# Patient Record
Sex: Female | Born: 1935 | Hispanic: No | Smoking: Never smoker
Health system: Southern US, Community
[De-identification: ages and names within clinical notes are randomized; demographics above are authoritative.]

## PROBLEM LIST (undated history)

## (undated) DIAGNOSIS — E785 Hyperlipidemia, unspecified: Secondary | ICD-10-CM

## (undated) DIAGNOSIS — F0390 Unspecified dementia without behavioral disturbance: Secondary | ICD-10-CM

## (undated) DIAGNOSIS — I10 Essential (primary) hypertension: Secondary | ICD-10-CM

## (undated) DIAGNOSIS — M1990 Unspecified osteoarthritis, unspecified site: Secondary | ICD-10-CM

## (undated) DIAGNOSIS — D329 Benign neoplasm of meninges, unspecified: Secondary | ICD-10-CM

## (undated) DIAGNOSIS — H9190 Unspecified hearing loss, unspecified ear: Secondary | ICD-10-CM

## (undated) HISTORY — PX: ABDOMINAL HYSTERECTOMY: SHX81

## (undated) HISTORY — PX: CHOLECYSTECTOMY: SHX55

---

## 2015-09-20 ENCOUNTER — Other Ambulatory Visit (HOSPITAL_COMMUNITY)
Admission: RE | Admit: 2015-09-20 | Discharge: 2015-09-20 | Disposition: A | Discharge status: Home / Self Care | Payer: Medicare Other | Source: Ambulatory Visit | Attending: Internal Medicine | Admitting: Internal Medicine

## 2015-09-20 DIAGNOSIS — N390 Urinary tract infection, site not specified: Secondary | ICD-10-CM | POA: Insufficient documentation

## 2015-09-20 LAB — URINALYSIS, ROUTINE W REFLEX MICROSCOPIC
Bilirubin Urine: NEGATIVE
GLUCOSE, UA: NEGATIVE mg/dL
HGB URINE DIPSTICK: NEGATIVE
Ketones, ur: NEGATIVE mg/dL
Nitrite: NEGATIVE
Protein, ur: 100 mg/dL — AB
SPECIFIC GRAVITY, URINE: 1.01 (ref 1.005–1.030)
pH: >9 — ABNORMAL HIGH (ref 5.0–8.0)

## 2015-09-20 LAB — URINE MICROSCOPIC-ADD ON
RBC / HPF: NONE SEEN RBC/hpf (ref 0–5)
SQUAMOUS EPITHELIAL / LPF: NONE SEEN

## 2015-09-22 LAB — URINE CULTURE

## 2015-09-26 ENCOUNTER — Inpatient Hospital Stay (HOSPITAL_COMMUNITY)
Admission: EM | Admit: 2015-09-26 | Discharge: 2015-10-03 | DRG: 579 | Disposition: A | Discharge status: Skilled Nursing Facility | Payer: Medicare Other | Attending: Internal Medicine | Admitting: Internal Medicine

## 2015-09-26 ENCOUNTER — Encounter (HOSPITAL_COMMUNITY): Payer: Self-pay | Admitting: Emergency Medicine

## 2015-09-26 DIAGNOSIS — Z66 Do not resuscitate: Secondary | ICD-10-CM | POA: Diagnosis present

## 2015-09-26 DIAGNOSIS — D638 Anemia in other chronic diseases classified elsewhere: Secondary | ICD-10-CM | POA: Diagnosis present

## 2015-09-26 DIAGNOSIS — Z9181 History of falling: Secondary | ICD-10-CM | POA: Diagnosis not present

## 2015-09-26 DIAGNOSIS — I129 Hypertensive chronic kidney disease with stage 1 through stage 4 chronic kidney disease, or unspecified chronic kidney disease: Secondary | ICD-10-CM | POA: Diagnosis present

## 2015-09-26 DIAGNOSIS — N183 Chronic kidney disease, stage 3 (moderate): Secondary | ICD-10-CM | POA: Diagnosis present

## 2015-09-26 DIAGNOSIS — D62 Acute posthemorrhagic anemia: Secondary | ICD-10-CM | POA: Diagnosis not present

## 2015-09-26 DIAGNOSIS — I959 Hypotension, unspecified: Secondary | ICD-10-CM | POA: Diagnosis present

## 2015-09-26 DIAGNOSIS — E46 Unspecified protein-calorie malnutrition: Secondary | ICD-10-CM | POA: Diagnosis not present

## 2015-09-26 DIAGNOSIS — Z79899 Other long term (current) drug therapy: Secondary | ICD-10-CM | POA: Diagnosis not present

## 2015-09-26 DIAGNOSIS — B964 Proteus (mirabilis) (morganii) as the cause of diseases classified elsewhere: Secondary | ICD-10-CM | POA: Diagnosis present

## 2015-09-26 DIAGNOSIS — D329 Benign neoplasm of meninges, unspecified: Secondary | ICD-10-CM | POA: Insufficient documentation

## 2015-09-26 DIAGNOSIS — D72829 Elevated white blood cell count, unspecified: Secondary | ICD-10-CM

## 2015-09-26 DIAGNOSIS — E860 Dehydration: Secondary | ICD-10-CM | POA: Diagnosis present

## 2015-09-26 DIAGNOSIS — L89154 Pressure ulcer of sacral region, stage 4: Principal | ICD-10-CM | POA: Diagnosis present

## 2015-09-26 DIAGNOSIS — L089 Local infection of the skin and subcutaneous tissue, unspecified: Secondary | ICD-10-CM | POA: Diagnosis present

## 2015-09-26 DIAGNOSIS — Z8249 Family history of ischemic heart disease and other diseases of the circulatory system: Secondary | ICD-10-CM | POA: Diagnosis not present

## 2015-09-26 DIAGNOSIS — L89159 Pressure ulcer of sacral region, unspecified stage: Secondary | ICD-10-CM | POA: Diagnosis present

## 2015-09-26 DIAGNOSIS — N390 Urinary tract infection, site not specified: Secondary | ICD-10-CM

## 2015-09-26 DIAGNOSIS — R627 Adult failure to thrive: Secondary | ICD-10-CM | POA: Diagnosis present

## 2015-09-26 DIAGNOSIS — N179 Acute kidney failure, unspecified: Secondary | ICD-10-CM | POA: Diagnosis present

## 2015-09-26 DIAGNOSIS — L8995 Pressure ulcer of unspecified site, unstageable: Secondary | ICD-10-CM

## 2015-09-26 DIAGNOSIS — Z993 Dependence on wheelchair: Secondary | ICD-10-CM | POA: Diagnosis not present

## 2015-09-26 DIAGNOSIS — H9190 Unspecified hearing loss, unspecified ear: Secondary | ICD-10-CM | POA: Insufficient documentation

## 2015-09-26 DIAGNOSIS — Z86011 Personal history of benign neoplasm of the brain: Secondary | ICD-10-CM | POA: Diagnosis not present

## 2015-09-26 DIAGNOSIS — D649 Anemia, unspecified: Secondary | ICD-10-CM

## 2015-09-26 DIAGNOSIS — L8990 Pressure ulcer of unspecified site, unspecified stage: Secondary | ICD-10-CM

## 2015-09-26 DIAGNOSIS — Z9049 Acquired absence of other specified parts of digestive tract: Secondary | ICD-10-CM | POA: Diagnosis not present

## 2015-09-26 HISTORY — DX: Benign neoplasm of meninges, unspecified: D32.9

## 2015-09-26 HISTORY — DX: Hyperlipidemia, unspecified: E78.5

## 2015-09-26 HISTORY — DX: Unspecified hearing loss, unspecified ear: H91.90

## 2015-09-26 HISTORY — DX: Unspecified osteoarthritis, unspecified site: M19.90

## 2015-09-26 HISTORY — DX: Essential (primary) hypertension: I10

## 2015-09-26 LAB — I-STAT CHEM 8, ED
BUN: 39 mg/dL — ABNORMAL HIGH (ref 6–20)
CALCIUM ION: 1.13 mmol/L (ref 1.13–1.30)
CHLORIDE: 102 mmol/L (ref 101–111)
Creatinine, Ser: 1 mg/dL (ref 0.44–1.00)
GLUCOSE: 124 mg/dL — AB (ref 65–99)
HCT: 23 % — ABNORMAL LOW (ref 36.0–46.0)
HEMOGLOBIN: 7.8 g/dL — AB (ref 12.0–15.0)
POTASSIUM: 3.6 mmol/L (ref 3.5–5.1)
SODIUM: 139 mmol/L (ref 135–145)
TCO2: 23 mmol/L (ref 0–100)

## 2015-09-26 LAB — URINALYSIS, ROUTINE W REFLEX MICROSCOPIC
Bilirubin Urine: NEGATIVE
GLUCOSE, UA: NEGATIVE mg/dL
Ketones, ur: NEGATIVE mg/dL
Nitrite: NEGATIVE
PROTEIN: 30 mg/dL — AB
SPECIFIC GRAVITY, URINE: 1.019 (ref 1.005–1.030)
pH: 5 (ref 5.0–8.0)

## 2015-09-26 LAB — URINE MICROSCOPIC-ADD ON

## 2015-09-26 LAB — CBC WITH DIFFERENTIAL/PLATELET
BASOS PCT: 0 %
Basophils Absolute: 0 10*3/uL (ref 0.0–0.1)
EOS ABS: 0 10*3/uL (ref 0.0–0.7)
EOS PCT: 0 %
HCT: 24.3 % — ABNORMAL LOW (ref 36.0–46.0)
Hemoglobin: 7.8 g/dL — ABNORMAL LOW (ref 12.0–15.0)
LYMPHS ABS: 0.9 10*3/uL (ref 0.7–4.0)
Lymphocytes Relative: 7 %
MCH: 30.5 pg (ref 26.0–34.0)
MCHC: 32.1 g/dL (ref 30.0–36.0)
MCV: 94.9 fL (ref 78.0–100.0)
MONO ABS: 0.7 10*3/uL (ref 0.1–1.0)
Monocytes Relative: 5 %
Neutro Abs: 11.5 10*3/uL — ABNORMAL HIGH (ref 1.7–7.7)
Neutrophils Relative %: 88 %
PLATELETS: 302 10*3/uL (ref 150–400)
RBC: 2.56 MIL/uL — ABNORMAL LOW (ref 3.87–5.11)
RDW: 13.8 % (ref 11.5–15.5)
WBC: 13.1 10*3/uL — ABNORMAL HIGH (ref 4.0–10.5)

## 2015-09-26 LAB — COMPREHENSIVE METABOLIC PANEL
ALK PHOS: 97 U/L (ref 38–126)
ALT: 19 U/L (ref 14–54)
AST: 37 U/L (ref 15–41)
Albumin: 2.4 g/dL — ABNORMAL LOW (ref 3.5–5.0)
Anion gap: 14 (ref 5–15)
BUN: 45 mg/dL — AB (ref 6–20)
CALCIUM: 8.9 mg/dL (ref 8.9–10.3)
CHLORIDE: 106 mmol/L (ref 101–111)
CO2: 23 mmol/L (ref 22–32)
CREATININE: 1.17 mg/dL — AB (ref 0.44–1.00)
GFR calc Af Amer: 47 mL/min — ABNORMAL LOW (ref >60)
GFR, EST NON AFRICAN AMERICAN: 40 mL/min — AB (ref >60)
Glucose, Bld: 125 mg/dL — ABNORMAL HIGH (ref 65–99)
Potassium: 3.6 mmol/L (ref 3.5–5.1)
SODIUM: 143 mmol/L (ref 135–145)
Total Bilirubin: 0.5 mg/dL (ref 0.3–1.2)
Total Protein: 6.2 g/dL — ABNORMAL LOW (ref 6.5–8.1)

## 2015-09-26 MED ORDER — MORPHINE SULFATE (PF) 2 MG/ML IV SOLN
1.0000 mg | Freq: Once | INTRAVENOUS | Status: AC
  Administered 2015-09-26: 1 mg via INTRAVENOUS
  Filled 2015-09-26: qty 1

## 2015-09-26 MED ORDER — VANCOMYCIN HCL IN DEXTROSE 750-5 MG/150ML-% IV SOLN
750.0000 mg | INTRAVENOUS | Status: DC
  Administered 2015-09-27 – 2015-09-28 (×2): 750 mg via INTRAVENOUS
  Filled 2015-09-26 (×3): qty 150

## 2015-09-26 MED ORDER — SODIUM CHLORIDE 0.9 % IV BOLUS (SEPSIS)
500.0000 mL | Freq: Once | INTRAVENOUS | Status: AC
  Administered 2015-09-26: 500 mL via INTRAVENOUS

## 2015-09-26 MED ORDER — POLYETHYL GLYCOL-PROPYL GLYCOL 0.4-0.3 % OP GEL: 1.0000 "application " | Freq: Every day | OPHTHALMIC | Status: DC | PRN

## 2015-09-26 MED ORDER — SODIUM CHLORIDE 0.9 % IV SOLN
INTRAVENOUS | Status: DC
  Administered 2015-09-27 – 2015-09-28 (×2): via INTRAVENOUS

## 2015-09-26 MED ORDER — LIP MEDEX EX OINT
TOPICAL_OINTMENT | CUTANEOUS | Status: DC | PRN
  Administered 2015-09-26: 22:00:00 via TOPICAL
  Filled 2015-09-26: qty 7

## 2015-09-26 MED ORDER — PRO-STAT SUGAR FREE PO LIQD
30.0000 mL | Freq: Three times a day (TID) | ORAL | Status: DC
  Administered 2015-09-27 – 2015-10-03 (×17): 30 mL via ORAL
  Filled 2015-09-26 (×17): qty 30

## 2015-09-26 MED ORDER — POLYVINYL ALCOHOL 1.4 % OP SOLN
1.0000 [drp] | OPHTHALMIC | Status: DC | PRN
  Filled 2015-09-26: qty 15

## 2015-09-26 MED ORDER — ASPIRIN-DIPYRIDAMOLE ER 25-200 MG PO CP12
1.0000 | ORAL_CAPSULE | Freq: Two times a day (BID) | ORAL | Status: DC
Start: 2015-09-26 — End: 2015-09-27
  Administered 2015-09-26: 1 via ORAL
  Filled 2015-09-26 (×3): qty 1

## 2015-09-26 MED ORDER — SACCHAROMYCES BOULARDII 250 MG PO CAPS
250.0000 mg | ORAL_CAPSULE | Freq: Two times a day (BID) | ORAL | Status: DC
  Administered 2015-09-26 – 2015-10-03 (×14): 250 mg via ORAL
  Filled 2015-09-26 (×14): qty 1

## 2015-09-26 MED ORDER — ENOXAPARIN SODIUM 40 MG/0.4ML ~~LOC~~ SOLN
40.0000 mg | SUBCUTANEOUS | Status: DC
  Administered 2015-09-26: 40 mg via SUBCUTANEOUS
  Filled 2015-09-26: qty 0.4

## 2015-09-26 MED ORDER — VANCOMYCIN HCL IN DEXTROSE 1-5 GM/200ML-% IV SOLN
1000.0000 mg | INTRAVENOUS | Status: AC
  Administered 2015-09-26: 1000 mg via INTRAVENOUS
  Filled 2015-09-26: qty 200

## 2015-09-26 MED ORDER — ZINC SULFATE 220 (50 ZN) MG PO CAPS
220.0000 mg | ORAL_CAPSULE | Freq: Every day | ORAL | Status: DC
  Administered 2015-09-27 – 2015-10-03 (×7): 220 mg via ORAL
  Filled 2015-09-26 (×7): qty 1

## 2015-09-26 MED ORDER — SENNOSIDES-DOCUSATE SODIUM 8.6-50 MG PO TABS
2.0000 | ORAL_TABLET | Freq: Every day | ORAL | Status: DC
  Administered 2015-09-26 – 2015-09-28 (×3): 2 via ORAL
  Filled 2015-09-26 (×6): qty 2

## 2015-09-26 MED ORDER — PIPERACILLIN-TAZOBACTAM 3.375 G IVPB
3.3750 g | Freq: Three times a day (TID) | INTRAVENOUS | Status: DC
  Administered 2015-09-26 – 2015-10-01 (×13): 3.375 g via INTRAVENOUS
  Filled 2015-09-26 (×15): qty 50

## 2015-09-26 MED ORDER — SODIUM CHLORIDE 0.9 % IV SOLN
INTRAVENOUS | Status: DC
  Administered 2015-09-26: 19:00:00 via INTRAVENOUS

## 2015-09-26 MED ORDER — OXYCODONE-ACETAMINOPHEN 5-325 MG PO TABS
1.0000 | ORAL_TABLET | Freq: Three times a day (TID) | ORAL | Status: DC | PRN
  Administered 2015-09-26 – 2015-10-02 (×8): 1 via ORAL
  Filled 2015-09-26 (×8): qty 1

## 2015-09-26 MED ORDER — VITAMIN C 500 MG PO TABS
500.0000 mg | ORAL_TABLET | Freq: Every day | ORAL | Status: DC
  Administered 2015-09-27 – 2015-10-03 (×7): 500 mg via ORAL
  Filled 2015-09-26 (×7): qty 1

## 2015-09-26 MED ORDER — PIPERACILLIN-TAZOBACTAM 3.375 G IVPB 30 MIN
3.3750 g | INTRAVENOUS | Status: AC
  Administered 2015-09-26: 3.375 g via INTRAVENOUS
  Filled 2015-09-26: qty 50

## 2015-09-26 MED ORDER — BENEPROTEIN PO POWD: 1.0000 | Freq: Three times a day (TID) | ORAL | Status: DC

## 2015-09-26 NOTE — ED Provider Notes (Signed)
CSN: [id number]     Arrival date & time 09/26/15  0941 History   First MD Initiated Contact with Patient 09/26/15 [phone]     Chief Complaint  Patient presents with  . Skin Ulcer      The history is provided by the patient.  Patient was reportedly sent in from the nursing home for large to this ulcer. Reportedly had been stage II but now stage IV. She's been on Augmentin and possibly Flagyl. Reportedly is also being treated for urinary tract infection. Patient states he has some pain in her rear end. No chest pain. She's been fatigued also.  Past Medical History  Diagnosis Date  . Hypertension   . Hyperlipidemia   . Meningioma ([place]) y-10  . Arthritis   . Hearing loss m-6   Past Surgical History  Procedure Laterality Date  . Cholecystectomy    . Abdominal hysterectomy     Family History  Problem Relation Age of Onset  . Heart disease Mother   . Hypertension Son   . Cancer Maternal Aunt     lung  . Cancer Paternal Aunt     liver   Social History  Substance Use Topics  . Smoking status: Never Smoker   . Smokeless tobacco: Never Used  . Alcohol Use: No   OB History    No data available     Review of Systems  Constitutional: Positive for fatigue. Negative for fever, chills and appetite change.  Cardiovascular: Negative for chest pain.  Gastrointestinal: Negative for rectal pain.  Musculoskeletal: Positive for back pain.  Skin: Positive for wound.  Neurological: Negative for numbness.      Allergies  Review of patient's allergies indicates no known allergies.  Home Medications   Prior to Admission medications   Medication Sig Start Date End Date Taking? Authorizing Provider  acetaminophen (TYLENOL) 325 MG tablet Take 650 mg by mouth every 4 (four) hours as needed (pain.).   Yes Historical Provider, MD  amoxicillin-clavulanate (AUGMENTIN) 875-125 MG tablet Take 1 tablet by mouth 2 (two) times daily.   Yes Historical Provider, MD  collagenase (SANTYL) ointment  Apply 1 application topically daily. Apply to coccyx topically one time a day.  Home health will dress wound.   Yes Historical Provider, MD  dipyridamole-aspirin (AGGRENOX) 200-25 MG 12hr capsule Take 1 capsule by mouth 2 (two) times daily.   Yes Historical Provider, MD  losartan-hydrochlorothiazide (HYZAAR) 100-25 MG tablet Take 1 tablet by mouth daily.   Yes Historical Provider, MD  Menthol-Methyl Salicylate (BENGAY GREASELESS EX) Apply 1 application topically 2 (two) times daily. To Right hip.   Yes Historical Provider, MD  metroNIDAZOLE (FLAGYL) 500 MG tablet Apply 500 mg topically daily. CRUSH and apply to sacral wound TOPICALLY one time daily for sacral wound until 10/03/15.   Yes Historical Provider, MD  oxyCODONE-acetaminophen (PERCOCET/ROXICET) 5-325 MG tablet Take 1 tablet by mouth every 8 (eight) hours as needed for severe pain (pain related to muscle weakness, generalized.).   Yes Historical Provider, MD  Polyethyl Glycol-Propyl Glycol (SYSTANE) 0.4-0.3 % GEL ophthalmic gel Place 1 application into both eyes daily as needed (drye eye syndrome.).   Yes Historical Provider, MD  saccharomyces boulardii (FLORASTOR) 250 MG capsule Take 250 mg by mouth 2 (two) times daily.   Yes Historical Provider, MD  senna-docusate (SENOKOT-S) 8.6-50 MG tablet Take 2 tablets by mouth at bedtime.   Yes Historical Provider, MD  protein supplement (RESOURCE BENEPROTEIN) POWD Take 1 scoop by mouth 3 (three) times  daily with meals.    Historical Provider, MD  vitamin C (ASCORBIC ACID) 500 MG tablet Take 500 mg by mouth daily.    Historical Provider, MD  zinc sulfate 220 MG capsule Take 220 mg by mouth daily.    Historical Provider, MD   BP 103/55 mmHg  Pulse 90  Temp(Src) 98.4 F (36.9 C) (Oral)  Resp 16  Ht 5\' 7"  (1.702 m)  Wt 160 lb (72.576 kg)  BMI 25.05 kg/m2  SpO2 98% Physical Exam  Constitutional: She is oriented to person, place, and time. She appears well-developed.  HENT:  Head: Atraumatic.   Cardiovascular:  Mild tachycardia  Pulmonary/Chest: Effort normal.  Abdominal: There is no tenderness.  Genitourinary:  Patient has large sacral decubitus ulcers with central deep ulcer that appears to track superiorly. There is foul-smelling purulent drainage.  Musculoskeletal: She exhibits no edema.  Neurological: She is alert and oriented to person, place, and time.  Skin: Skin is warm.    ED Course  Procedures (including critical care time) Labs Review Labs Reviewed  URINALYSIS, ROUTINE W REFLEX MICROSCOPIC (NOT AT [facility]) - Abnormal; Notable for the following:    Color, Urine AMBER (*)    APPearance CLOUDY (*)    Hgb urine dipstick TRACE (*)    Protein, ur 30 (*)    Leukocytes, UA LARGE (*)    All other components within normal limits  COMPREHENSIVE METABOLIC PANEL - Abnormal; Notable for the following:    Glucose, Bld 125 (*)    BUN 45 (*)    Creatinine, Ser 1.17 (*)    Total Protein 6.2 (*)    Albumin 2.4 (*)    GFR calc non Af Amer 40 (*)    GFR calc Af Amer 47 (*)    All other components within normal limits  CBC WITH DIFFERENTIAL/PLATELET - Abnormal; Notable for the following:    WBC 13.1 (*)    RBC 2.56 (*)    Hemoglobin 7.8 (*)    HCT 24.3 (*)    Neutro Abs 11.5 (*)    All other components within normal limits  URINE MICROSCOPIC-ADD ON - Abnormal; Notable for the following:    Squamous Epithelial / LPF 0-5 (*)    Bacteria, UA FEW (*)    Casts GRANULAR CAST (*)    All other components within normal limits  I-STAT CHEM 8, ED - Abnormal; Notable for the following:    BUN 39 (*)    Glucose, Bld 124 (*)    Hemoglobin 7.8 (*)    HCT 23.0 (*)    All other components within normal limits  URINE CULTURE    Imaging Review No results found. I have personally reviewed and evaluated these images and lab results as part of my medical decision-making.   EKG Interpretation None      MDM   Final diagnoses:  Infected decubitus ulcer, unspecified pressure  ulcer stage    Patient with large decubitus ulcer. Has been on antibiotics. Will admit to internal medicine.    Davonna Belling, MD 09/26/15 [phone]

## 2015-09-26 NOTE — ED Notes (Signed)
Per PTAR states recently treated for decub-also on antibiotics for UTI-states admitted to [facility] for weakness-states decub went from stage II to stage IV-

## 2015-09-26 NOTE — ED Notes (Signed)
Bed: WA03 Expected date:  Expected time:  Means of arrival:  Comments: Ems-DECUB

## 2015-09-26 NOTE — H&P (Addendum)
History and Physical:    Sandra Flowers   [id number] DOB: 05-25-1925 DOA: 09/26/2015  Referring MD/provider: Davonna Belling, MD PCP: Kathreen Cosier, MD  Chief Complaint: Worsening decubitus ulcer on sacrum. Increasing sleepiness and pain over the last 3 days.  History of Present Illness:   Sandra Flowers is an 82 y.o. female Pt is a 82 y/o female who is been mostly W/C bound for the last month at an assisted living facility. Pt's confinement was triggered by a fall in January which led to a prolonged hospitalization (2 weeks)and then to rehab for a month and then to ALF.   Patient is unable to give any information but her daughter Sandra Flowers is her Sandra Flowers is the historian. Daughter reports that she became aware of a sacral wound about 2 weeks which per her daughter was deteriorating. Daughter noted that she was also becoming more sleepy and lethargic and pt c/o of worsening pain in right hip. According to daughter she was being treated for the wound but despite treatment the would worsened. She has also been receiving a pain pill (Oxycodone) for the pain in her hip.   Daughter reports that her appetite has been good but her intake poor since she did not like the food at the ALF. Her liquid intake has been poor. She has had a foley for 3 days for diversion of urine with regard to the wound. As far as the daughter knows, she had had no fevers, nausea and diarrhea. LAst tine OOB was this morning when she was wheeled to breakfast.   Patient and daughter have both verified that her code status is DNR.   ROS:   Review of Systems  Constitutional: Negative.  Negative for fever and chills.  HENT: Positive for hearing loss. Negative for congestion, ear pain, sore throat and tinnitus.   Eyes: Negative for blurred vision and pain.  Respiratory: Negative for cough, shortness of breath and wheezing.   Cardiovascular: Negative for chest pain, palpitations and leg swelling.    Gastrointestinal: Positive for constipation. Negative for diarrhea, blood in stool and melena.  Genitourinary: Negative for dysuria, urgency and frequency.  Musculoskeletal: Positive for myalgias and joint pain (Right hip, knee and ankle pain).  Neurological: Negative for dizziness, tingling, speech change, focal weakness, weakness and headaches.  Endo/Heme/Allergies: Bruises/bleeds easily.  Psychiatric/Behavioral: Negative for depression and memory loss. The patient is not nervous/anxious.      Past Medical History:   Past Medical History  Diagnosis Date  . Hypertension   . Hyperlipidemia   . Meningioma ([place]) y-10  . Arthritis   . Hearing loss m-6    Past Surgical History:   Past Surgical History  Procedure Laterality Date  . Cholecystectomy    . Abdominal hysterectomy      Social History:   Social History   Social History  . Marital Status: Unknown    Spouse Name: N/A  . Number of Children: N/A  . Years of Education: N/A   Occupational History  . Not on file.   Social History Main Topics  . Smoking status: Never Smoker   . Smokeless tobacco: Never Used  . Alcohol Use: No  . Drug Use: Not on file  . Sexual Activity: No   Other Topics Concern  . Not on file   Social History Narrative  . No narrative on file    Family history:   Family History  Problem Relation Age of Onset  . Heart disease Mother   .  Hypertension Son   . Cancer Maternal Aunt     lung  . Cancer Paternal Aunt     liver    Allergies   Review of patient's allergies indicates no known allergies.  Current Medications:   Prior to Admission medications   Medication Sig Start Date End Date Taking? Authorizing Provider  acetaminophen (TYLENOL) 325 MG tablet Take 650 mg by mouth every 4 (four) hours as needed (pain.).   Yes Historical Provider, MD  amoxicillin-clavulanate (AUGMENTIN) 875-125 MG tablet Take 1 tablet by mouth 2 (two) times daily.   Yes Historical Provider, MD   collagenase (SANTYL) ointment Apply 1 application topically daily. Apply to coccyx topically one time a day.  Home health will dress wound.   Yes Historical Provider, MD  dipyridamole-aspirin (AGGRENOX) 200-25 MG 12hr capsule Take 1 capsule by mouth 2 (two) times daily.   Yes Historical Provider, MD  losartan-hydrochlorothiazide (HYZAAR) 100-25 MG tablet Take 1 tablet by mouth daily.   Yes Historical Provider, MD  Menthol-Methyl Salicylate (BENGAY GREASELESS EX) Apply 1 application topically 2 (two) times daily. To Right hip.   Yes Historical Provider, MD  metroNIDAZOLE (FLAGYL) 500 MG tablet Apply 500 mg topically daily. CRUSH and apply to sacral wound TOPICALLY one time daily for sacral wound until 10/03/15.   Yes Historical Provider, MD  oxyCODONE-acetaminophen (PERCOCET/ROXICET) 5-325 MG tablet Take 1 tablet by mouth every 8 (eight) hours as needed for severe pain (pain related to muscle weakness, generalized.).   Yes Historical Provider, MD  Polyethyl Glycol-Propyl Glycol (SYSTANE) 0.4-0.3 % GEL ophthalmic gel Place 1 application into both eyes daily as needed (drye eye syndrome.).   Yes Historical Provider, MD  saccharomyces boulardii (FLORASTOR) 250 MG capsule Take 250 mg by mouth 2 (two) times daily.   Yes Historical Provider, MD  senna-docusate (SENOKOT-S) 8.6-50 MG tablet Take 2 tablets by mouth at bedtime.   Yes Historical Provider, MD  protein supplement (RESOURCE BENEPROTEIN) POWD Take 1 scoop by mouth 3 (three) times daily with meals.    Historical Provider, MD  vitamin C (ASCORBIC ACID) 500 MG tablet Take 500 mg by mouth daily.    Historical Provider, MD  zinc sulfate 220 MG capsule Take 220 mg by mouth daily.    Historical Provider, MD    Physical Exam:   Filed Vitals:   09/26/15 1250 09/26/15 1252 09/26/15 1255 09/26/15 1415  BP: 192/167  103/55   Pulse: 82   90  Temp:      TempSrc:      Resp: 14  16   Height:  5\' 7"  (1.702 m)    Weight:  160 lb (72.576 kg)    SpO2: 94%    98%     Physical Exam: Blood pressure 103/55, pulse 90, temperature 98.4 F (36.9 C), temperature source Oral, resp. rate 16, height 5\' 7"  (1.702 m), weight 160 lb (72.576 kg), SpO2 98 %.  General: Alert, awake, oriented x3, in mild distress secondary to hip pain.Marland Kitchen  HEENT: [place]/AT PEERL, EOMI, anicteric, arcus seniles present in both eyes. Neck: Trachea midline, no masses, no thyromegal,y no JVD, no carotid bruit OROPHARYNX: Mucosa dry, No exudate/ erythema/lesions. Upper dentures present Heart: Regular rate and rhythm, without murmurs, rubs, gallops or S3. PMI non-displaced. Exam reveals decreased pulses in B/L dorsalis pedis. Pulmonary/Chest: Normal effort. Breath sounds normal. No. Apnea. Clear to auscultation,no stridor,  no wheezing and no rhonchi noted. No respiratory distress and no tenderness noted. Abdomen: Soft, nontender, nondistended, normal bowel sounds, no masses  no hepatosplenomegaly noted. No fluid wave and no ascites. There is no guarding or rebound. Neuro: Alert and oriented to person, place and time. Normal motor skills, Displays atrophy of BLE's. Pt has decreased muscle tone  or tremors and exhibits normal .  No focal neurological deficits noted cranial nerves II through XII grossly intact. No sensory deficit noted. DTRs 2+ bilaterally upper and lower extremities. Strength at baseline in bilateral upper and lower extremities. Gait normal. Musculoskeletal: No warm swelling or erythema around joints, no spinal tenderness noted. Psychiatric: Patient alert and oriented x3, good insight and cognition, good recent to remote recall. Lymph node survey: No cervical axillary or inguinal lymphadenopathy noted. Skin: Skin is warm and dry with tenting noted throughout. She has a unstageable sacral wound with tunneling present and foul smelling discharge noted. Pt also has dry cracked heels and skin breakdown at both heels. She has some areas of pressure on the IP joints of the hands and  bruises on the dorsal surface of toes.  No ecchymosis and no rash noted. Pt is not diaphoretic. No erythema. No pallor (all present on admission). Genitalia: External genitalia normal. Foley catheter in place. Psychiatric: Mood, memory, affect and judgement normal   Data Review:    Labs: Basic Metabolic Panel:  Recent Labs Lab 09/26/15 1138 09/26/15 1225  NA 139 143  K 3.6 3.6  CL 102 106  CO2  --  23  GLUCOSE 124* 125*  BUN 39* 45*  CREATININE 1.00 1.17*  CALCIUM  --  8.9   Liver Function Tests:  Recent Labs Lab 09/26/15 1225  AST 37  ALT 19  ALKPHOS 97  BILITOT 0.5  PROT 6.2*  ALBUMIN 2.4*   No results for input(s): LIPASE, AMYLASE in the last 168 hours. No results for input(s): AMMONIA in the last 168 hours. CBC:  Recent Labs Lab 09/26/15 1138 09/26/15 1225  WBC  --  13.1*  NEUTROABS  --  11.5*  HGB 7.8* 7.8*  HCT 23.0* 24.3*  MCV  --  94.9  PLT  --  302   Cardiac Enzymes: No results for input(s): CKTOTAL, CKMB, CKMBINDEX, TROPONINI in the last 168 hours.  BNP (last 3 results) No results for input(s): PROBNP in the last 8760 hours. CBG: No results for input(s): GLUCAP in the last 168 hours.  Radiographic Studies: No results found.       Assessment/Plan:   Active Problems: 1. Infected Sacral Pressure Ulcer (UnstageableStage 4 with tunneling) present on admission: Will ask surgery to see patient to see in consult. Will not obtain MRI at this time as given the architecture if the wound the stage and presence of osteomyelitis will lilely be determined at exploration. Started vancomycin and Zosyn. And continue Percocet for pain management. Obtain nutrition consult. Also obtain micro-albumin levels 2. UTI:  Send urine for culture. Continue foley to divert urine. Continue Vancomycin and Zosyn.  3. Dehydration: Patient has had decreased oral liquid intake recently. She is on IV fluids at 75 ml/h. 4. Acute Kidney Injury: Appears to be pre-renal given  BUN/Cr ratio. 5. Anemia: No history or evidence of bleeding. Likely anemia of chronic disease. Will obtain iron studies. 6. Leukocytosis: Secondary to infection. Trend. 7. Code Status: Pt states that she wants to be DNR and understands that there will be no CPR or ventilator use. She does not want to be resuscitated under any circumstances, Daughter at bedside and confirms DNR.  DVT prophylaxis - Lovenox ordered.  Code Status / Family Communication /  Disposition Plan:   Code Status: DNR Family Communication: Daughter at bedside and updated Disposition Plan: Will likely require skilled facility at time of discharge  Attestation regarding necessity of inpatient status:   The appropriate admission status for this patient is INPATIENT. Inpatient status is judged to be reasonable and necessary in order to provide the required intensity of service to ensure the patient's safety. The patient's presenting symptoms, physical exam findings, and initial radiographic and laboratory data in the context of their chronic comorbidities is felt to place them at high risk for further clinical deterioration. Furthermore, it is not anticipated that the patient will be medically stable for discharge from the hospital within 2 midnights of admission. The following factors support the admission status of inpatient.   -The patient's presenting symptoms include Infected wound. - The worrisome physical exam findings include  Unstageable infected wound with foul smelling discharge. - The initial radiographic and laboratory data are worrisome because of Leukocytosis, decreased renal function. - The chronic co-morbidities include Immobility. - Patient requires inpatient status due to high intensity of service, high risk for further deterioration and high frequency of surveillance required. - I certify that at the point of admission it is my clinical judgment that the patient will require inpatient hospital care spanning  beyond 2 midnights from the point of admission.   Time spent: 1 hour  MATTHEWS,MICHELLE A. [facility] Pager [phone]   If 7PM-7AM, please contact night-coverage [email or web address] Password [facility] 09/26/2015, 3:45 PM

## 2015-09-26 NOTE — Progress Notes (Signed)
Pharmacy Antibiotic Note  Sandra Flowers is a 82 y.o. female admitted on 09/26/2015 from nursing home with large sacral decub ulcer.  Pharmacy has been consulted for Zosyn and Vancomycin dosing.  Plan:  Zosyn 3.375g IV STAT over 30 minutes, then 3.375g IV Q8H infused over 4hrs.  Vancomycin 1g IV STAT, then 750 mg IV q24h.  Measure Vanc trough at steady state.  Follow up renal fxn, culture results, and clinical course.   Height: 5\' 7"  (170.2 cm) Weight: 160 lb (72.576 kg) IBW/kg (Calculated) : 61.6 Ht and wt info not yet updated in [facility].  [facility] reports that most recent (09/05/15) height 5'7" and weight 160 lbs (72.7 kg)  Temp (24hrs), Avg:98.4 F (36.9 C), Min:98.4 F (36.9 C), Max:98.4 F (36.9 C)   Recent Labs Lab 09/26/15 1138 09/26/15 1225  WBC  --  13.1*  CREATININE 1.00 1.17*    Estimated Creatinine Clearance: 32.3 mL/min (by C-G formula based on Cr of 1.17).    No Known Allergies  Antimicrobials this admission: 3/15 Zosyn >>  3/15 Vancomycin >>   Dose adjustments this admission:  Microbiology results: 3/15 UCx: ordered 3/15 Wound cxt: ordered  Thank you for allowing pharmacy to be a part of this patient's care.  Gretta Arab PharmD, BCPS Pager [phone] 09/26/2015 11:03 AM

## 2015-09-26 NOTE — ED Notes (Signed)
Urine from existing foley

## 2015-09-26 NOTE — ED Notes (Signed)
4 ATTEMPTS TO DRAW BLOOD WITH TWO BEING IV START ATTEMPTS.

## 2015-09-26 NOTE — Consult Note (Signed)
Reason for Consult:Decubitus Ulcer Referring Physician: Dr Rocky Link is an 82 y.o. female.  HPI: Pt s/p fall and resides in SNF.  Developed a decubitus ulcer that has been treated over the past few weeks.  Pain has worsened.  Pt becoming lethargic and has had LOA.  Brought to ED for evaluation.  Past Medical History  Diagnosis Date  . Hypertension   . Hyperlipidemia   . Meningioma ([place]) y-10  . Arthritis   . Hearing loss m-6    Past Surgical History  Procedure Laterality Date  . Cholecystectomy    . Abdominal hysterectomy      Family History  Problem Relation Age of Onset  . Heart disease Mother   . Hypertension Son   . Cancer Maternal Aunt     lung  . Cancer Paternal Aunt     liver    Social History:  reports that she has never smoked. She has never used smokeless tobacco. She reports that she does not drink alcohol. Her drug history is not on file.  Allergies: No Known Allergies  Medications: I have reviewed the patient's current medications.  Results for orders placed or performed during the hospital encounter of 09/26/15 (from the past 48 hour(s))  I-Stat Chem 8, ED     Status: Abnormal   Collection Time: 09/26/15 11:38 AM  Result Value Ref Range   Sodium 139 135 - 145 mmol/L   Potassium 3.6 3.5 - 5.1 mmol/L   Chloride 102 101 - 111 mmol/L   BUN 39 (H) 6 - 20 mg/dL   Creatinine, Ser 1.00 0.44 - 1.00 mg/dL   Glucose, Bld 124 (H) 65 - 99 mg/dL   Calcium, Ion 1.13 1.13 - 1.30 mmol/L   TCO2 23 0 - 100 mmol/L   Hemoglobin 7.8 (L) 12.0 - 15.0 g/dL   HCT 23.0 (L) 36.0 - 46.0 %  Comprehensive metabolic panel     Status: Abnormal   Collection Time: 09/26/15 12:25 PM  Result Value Ref Range   Sodium 143 135 - 145 mmol/L   Potassium 3.6 3.5 - 5.1 mmol/L   Chloride 106 101 - 111 mmol/L   CO2 23 22 - 32 mmol/L   Glucose, Bld 125 (H) 65 - 99 mg/dL   BUN 45 (H) 6 - 20 mg/dL   Creatinine, Ser 1.17 (H) 0.44 - 1.00 mg/dL   Calcium 8.9 8.9 - 10.3 mg/dL   Total Protein 6.2 (L) 6.5 - 8.1 g/dL   Albumin 2.4 (L) 3.5 - 5.0 g/dL   AST 37 15 - 41 U/L   ALT 19 14 - 54 U/L   Alkaline Phosphatase 97 38 - 126 U/L   Total Bilirubin 0.5 0.3 - 1.2 mg/dL   GFR calc non Af Amer 40 (L) >60 mL/min   GFR calc Af Amer 47 (L) >60 mL/min    Comment: (NOTE) The eGFR has been calculated using the CKD EPI equation. This calculation has not been validated in all clinical situations. eGFR's persistently <60 mL/min signify possible Chronic Kidney Disease.    Anion gap 14 5 - 15  CBC with Differential     Status: Abnormal   Collection Time: 09/26/15 12:25 PM  Result Value Ref Range   WBC 13.1 (H) 4.0 - 10.5 K/uL   RBC 2.56 (L) 3.87 - 5.11 MIL/uL   Hemoglobin 7.8 (L) 12.0 - 15.0 g/dL   HCT 24.3 (L) 36.0 - 46.0 %   MCV 94.9 78.0 - 100.0 fL  MCH 30.5 26.0 - 34.0 pg   MCHC 32.1 30.0 - 36.0 g/dL   RDW 13.8 11.5 - 15.5 %   Platelets 302 150 - 400 K/uL   Neutrophils Relative % 88 %   Lymphocytes Relative 7 %   Monocytes Relative 5 %   Eosinophils Relative 0 %   Basophils Relative 0 %   Neutro Abs 11.5 (H) 1.7 - 7.7 K/uL   Lymphs Abs 0.9 0.7 - 4.0 K/uL   Monocytes Absolute 0.7 0.1 - 1.0 K/uL   Eosinophils Absolute 0.0 0.0 - 0.7 K/uL   Basophils Absolute 0.0 0.0 - 0.1 K/uL   RBC Morphology POLYCHROMASIA PRESENT   Urinalysis, Routine w reflex microscopic     Status: Abnormal   Collection Time: 09/26/15  2:09 PM  Result Value Ref Range   Color, Urine AMBER (A) YELLOW    Comment: BIOCHEMICALS MAY BE AFFECTED BY COLOR   APPearance CLOUDY (A) CLEAR   Specific Gravity, Urine 1.019 1.005 - 1.030   pH 5.0 5.0 - 8.0   Glucose, UA NEGATIVE NEGATIVE mg/dL   Hgb urine dipstick TRACE (A) NEGATIVE   Bilirubin Urine NEGATIVE NEGATIVE   Ketones, ur NEGATIVE NEGATIVE mg/dL   Protein, ur 30 (A) NEGATIVE mg/dL   Nitrite NEGATIVE NEGATIVE   Leukocytes, UA LARGE (A) NEGATIVE  Urine microscopic-add on     Status: Abnormal   Collection Time: 09/26/15  2:09 PM  Result  Value Ref Range   Squamous Epithelial / LPF 0-5 (A) NONE SEEN   WBC, UA TOO NUMEROUS TO COUNT 0 - 5 WBC/hpf   RBC / HPF 0-5 0 - 5 RBC/hpf   Bacteria, UA FEW (A) NONE SEEN   Casts GRANULAR CAST (A) NEGATIVE   Urine-Other AMORPHOUS URATES/PHOSPHATES     Comment: MUCOUS PRESENT    No results found.  Review of Systems  Constitutional: Negative for fever and chills.  HENT: Positive for hearing loss.   Eyes: Negative for blurred vision.  Cardiovascular: Negative for chest pain.  Gastrointestinal: Positive for constipation. Negative for nausea, vomiting and abdominal pain.  Genitourinary: Negative for dysuria, urgency and frequency.  Musculoskeletal: Negative for myalgias.  Skin: Negative for rash.  Neurological: Negative for dizziness and headaches.   Blood pressure 93/42, pulse 85, temperature 98 F (36.7 C), temperature source Oral, resp. rate 18, height _0  (1.702 m), weight 72.576 kg (160 lb), SpO2 100 %. Physical Exam  Constitutional: She appears well-nourished. No distress.  HENT:  Head: Normocephalic and atraumatic.  Eyes: Conjunctivae are normal. Pupils are equal, round, and reactive to light.  Neck: Normal range of motion. Neck supple.  Cardiovascular: Normal rate and regular rhythm.   Respiratory: Effort normal and breath sounds normal.  GI: Soft. She exhibits no distension. There is no tenderness.  Musculoskeletal: Normal range of motion.  Neurological: She is alert.  Skin: Skin is warm and dry.  Large decubitus ulcer with necrotic tissue    Assessment/Plan: 82 y.o. F with decubitus ulcer with necrosis.  I have recommended debridement in the OR tomorrow.  I have discussed this with the patient and her children in detail.  All questions were answered.    Rahul Malinak C. 01/21/1974, 8:83 PM

## 2015-09-27 ENCOUNTER — Inpatient Hospital Stay (HOSPITAL_COMMUNITY): Payer: Medicare Other | Admitting: Anesthesiology

## 2015-09-27 ENCOUNTER — Encounter (HOSPITAL_COMMUNITY)
Admission: EM | Disposition: A | Discharge status: Skilled Nursing Facility | Payer: Self-pay | Source: Home / Self Care | Attending: Internal Medicine

## 2015-09-27 ENCOUNTER — Encounter (HOSPITAL_COMMUNITY): Payer: Self-pay | Admitting: General Surgery

## 2015-09-27 DIAGNOSIS — N183 Chronic kidney disease, stage 3 (moderate): Secondary | ICD-10-CM

## 2015-09-27 HISTORY — PX: DEBRIDMENT OF DECUBITUS ULCER: SHX6276

## 2015-09-27 LAB — BASIC METABOLIC PANEL
Anion gap: 11 (ref 5–15)
BUN: 42 mg/dL — AB (ref 6–20)
CHLORIDE: 107 mmol/L (ref 101–111)
CO2: 23 mmol/L (ref 22–32)
CREATININE: 1.08 mg/dL — AB (ref 0.44–1.00)
Calcium: 8.5 mg/dL — ABNORMAL LOW (ref 8.9–10.3)
GFR calc Af Amer: 52 mL/min — ABNORMAL LOW (ref >60)
GFR calc non Af Amer: 44 mL/min — ABNORMAL LOW (ref >60)
GLUCOSE: 89 mg/dL (ref 65–99)
Potassium: 3.7 mmol/L (ref 3.5–5.1)
Sodium: 141 mmol/L (ref 135–145)

## 2015-09-27 LAB — URINE CULTURE: Culture: 2000

## 2015-09-27 LAB — RETICULOCYTES
RBC.: 2.08 MIL/uL — AB (ref 3.87–5.11)
RETIC CT PCT: 1.3 % (ref 0.4–3.1)
Retic Count, Absolute: 27 10*3/uL (ref 19.0–186.0)

## 2015-09-27 LAB — MRSA PCR SCREENING: MRSA by PCR: NEGATIVE

## 2015-09-27 LAB — PREALBUMIN: Prealbumin: 5.8 mg/dL — ABNORMAL LOW (ref 18–38)

## 2015-09-27 LAB — FOLATE: Folate: 20.9 ng/mL (ref >5.9)

## 2015-09-27 LAB — IRON AND TIBC
Iron: 20 ug/dL — ABNORMAL LOW (ref 28–170)
SATURATION RATIOS: 15 % (ref 10.4–31.8)
TIBC: 130 ug/dL — ABNORMAL LOW (ref 250–450)
UIBC: 110 ug/dL

## 2015-09-27 LAB — VITAMIN B12: Vitamin B-12: 313 pg/mL (ref 180–914)

## 2015-09-27 LAB — FERRITIN: Ferritin: 352 ng/mL — ABNORMAL HIGH (ref 11–307)

## 2015-09-27 SURGERY — DEBRIDMENT OF DECUBITUS ULCER
Anesthesia: General | Site: Buttocks

## 2015-09-27 MED ORDER — PROPOFOL 10 MG/ML IV BOLUS
INTRAVENOUS | Status: AC
  Filled 2015-09-27: qty 20

## 2015-09-27 MED ORDER — FENTANYL CITRATE (PF) 100 MCG/2ML IJ SOLN
INTRAMUSCULAR | Status: AC
  Filled 2015-09-27: qty 2

## 2015-09-27 MED ORDER — MORPHINE SULFATE (PF) 2 MG/ML IV SOLN
1.0000 mg | INTRAVENOUS | Status: DC | PRN
  Administered 2015-09-27 – 2015-09-30 (×2): 1 mg via INTRAVENOUS
  Filled 2015-09-27 (×2): qty 1

## 2015-09-27 MED ORDER — LIDOCAINE HCL (CARDIAC) 20 MG/ML IV SOLN
INTRAVENOUS | Status: DC | PRN
  Administered 2015-09-27: 50 mg via INTRATRACHEAL

## 2015-09-27 MED ORDER — PIPERACILLIN-TAZOBACTAM 3.375 G IVPB
3.3750 g | Freq: Once | INTRAVENOUS | Status: AC
  Administered 2015-09-27: 3.375 g via INTRAVENOUS

## 2015-09-27 MED ORDER — ACETAMINOPHEN 10 MG/ML IV SOLN
INTRAVENOUS | Status: AC
Start: 2015-09-27 — End: 2015-09-27
  Filled 2015-09-27: qty 100

## 2015-09-27 MED ORDER — FENTANYL CITRATE (PF) 100 MCG/2ML IJ SOLN
INTRAMUSCULAR | Status: DC | PRN
  Administered 2015-09-27 (×2): 25 ug via INTRAVENOUS

## 2015-09-27 MED ORDER — LACTATED RINGERS IV SOLN: INTRAVENOUS | Status: DC

## 2015-09-27 MED ORDER — ONDANSETRON HCL 4 MG/2ML IJ SOLN
INTRAMUSCULAR | Status: AC
  Filled 2015-09-27: qty 2

## 2015-09-27 MED ORDER — ONDANSETRON HCL 4 MG/2ML IJ SOLN: 4.0000 mg | Freq: Once | INTRAMUSCULAR | Status: DC | PRN

## 2015-09-27 MED ORDER — MORPHINE SULFATE (PF) 2 MG/ML IV SOLN
2.0000 mg | INTRAVENOUS | Status: DC | PRN
  Administered 2015-09-27: 2 mg via INTRAVENOUS
  Administered 2015-09-27 – 2015-09-28 (×2): 4 mg via INTRAVENOUS
  Administered 2015-09-28: 2 mg via INTRAVENOUS
  Administered 2015-09-28 – 2015-09-30 (×7): 4 mg via INTRAVENOUS
  Administered 2015-09-30: 3 mg via INTRAVENOUS
  Administered 2015-09-30: 4 mg via INTRAVENOUS
  Administered 2015-09-30: 2 mg via INTRAVENOUS
  Administered 2015-10-01 – 2015-10-02 (×7): 4 mg via INTRAVENOUS
  Administered 2015-10-02 (×2): 2 mg via INTRAVENOUS
  Administered 2015-10-02: 4 mg via INTRAVENOUS
  Filled 2015-09-27: qty 1
  Filled 2015-09-27 (×13): qty 2
  Filled 2015-09-27 (×3): qty 1
  Filled 2015-09-27 (×6): qty 2
  Filled 2015-09-27: qty 1

## 2015-09-27 MED ORDER — ONDANSETRON HCL 4 MG/2ML IJ SOLN
INTRAMUSCULAR | Status: DC | PRN
  Administered 2015-09-27: 4 mg via INTRAVENOUS

## 2015-09-27 MED ORDER — LACTATED RINGERS IV SOLN
INTRAVENOUS | Status: DC | PRN
  Administered 2015-09-27: 10:00:00 via INTRAVENOUS

## 2015-09-27 MED ORDER — ROCURONIUM BROMIDE 100 MG/10ML IV SOLN
INTRAVENOUS | Status: DC | PRN
  Administered 2015-09-27: 30 mg via INTRAVENOUS

## 2015-09-27 MED ORDER — FENTANYL CITRATE (PF) 100 MCG/2ML IJ SOLN
25.0000 ug | INTRAMUSCULAR | Status: DC | PRN
  Administered 2015-09-27: 25 ug via INTRAVENOUS
  Administered 2015-09-27: 50 ug via INTRAVENOUS
  Administered 2015-09-27: 25 ug via INTRAVENOUS

## 2015-09-27 MED ORDER — ACETAMINOPHEN 10 MG/ML IV SOLN
1000.0000 mg | Freq: Once | INTRAVENOUS | Status: AC
  Administered 2015-09-27: 1000 mg via INTRAVENOUS

## 2015-09-27 MED ORDER — SUGAMMADEX SODIUM 200 MG/2ML IV SOLN
INTRAVENOUS | Status: AC
  Filled 2015-09-27: qty 2

## 2015-09-27 MED ORDER — SUGAMMADEX SODIUM 200 MG/2ML IV SOLN
INTRAVENOUS | Status: DC | PRN
  Administered 2015-09-27: 200 mg via INTRAVENOUS

## 2015-09-27 MED ORDER — PROPOFOL 10 MG/ML IV BOLUS
INTRAVENOUS | Status: DC | PRN
  Administered 2015-09-27 (×2): 50 mg via INTRAVENOUS

## 2015-09-27 SURGICAL SUPPLY — 32 items
BANDAGE GAUZE ELAST BULKY 4 IN (GAUZE/BANDAGES/DRESSINGS) IMPLANT
BENZOIN TINCTURE PRP APPL 2/3 (GAUZE/BANDAGES/DRESSINGS) IMPLANT
BNDG CONFORM 2 STRL LF (GAUZE/BANDAGES/DRESSINGS) IMPLANT
BNDG GAUZE ELAST 4 BULKY (GAUZE/BANDAGES/DRESSINGS) ×6 IMPLANT
BRIEF STRETCH FOR OB PAD LRG (UNDERPADS AND DIAPERS) ×3 IMPLANT
CONNECTOR 5 IN 1 STRAIGHT STRL (MISCELLANEOUS) ×3 IMPLANT
COVER SURGICAL LIGHT HANDLE (MISCELLANEOUS) IMPLANT
DRAPE LAPAROTOMY T 102X78X121 (DRAPES) IMPLANT
DRSG PAD ABDOMINAL 8X10 ST (GAUZE/BANDAGES/DRESSINGS) ×6 IMPLANT
ELECT CAUTERY BLADE 6.4 (BLADE) ×3 IMPLANT
ELECT REM PT RETURN 9FT ADLT (ELECTROSURGICAL) ×3
ELECTRODE REM PT RTRN 9FT ADLT (ELECTROSURGICAL) ×1 IMPLANT
GAUZE SPONGE 4X4 12PLY STRL (GAUZE/BANDAGES/DRESSINGS) IMPLANT
GLOVE BIO SURGEON STRL SZ 6.5 (GLOVE) ×2 IMPLANT
GLOVE BIO SURGEONS STRL SZ 6.5 (GLOVE) ×1
GLOVE BIOGEL PI IND STRL 7.0 (GLOVE) ×1 IMPLANT
GLOVE BIOGEL PI INDICATOR 7.0 (GLOVE) ×2
KIT BASIN OR (CUSTOM PROCEDURE TRAY) ×3 IMPLANT
NEEDLE HYPO 22GX1.5 SAFETY (NEEDLE) IMPLANT
NS IRRIG 1000ML POUR BTL (IV SOLUTION) ×3 IMPLANT
PACK GENERAL/GYN (CUSTOM PROCEDURE TRAY) ×3 IMPLANT
PAD ABD 7.5X8 STRL (GAUZE/BANDAGES/DRESSINGS) ×3 IMPLANT
SPONGE LAP 18X18 X RAY DECT (DISPOSABLE) IMPLANT
SWAB COLLECTION DEVICE MRSA (MISCELLANEOUS) IMPLANT
SWAB CULTURE ESWAB REG 1ML (MISCELLANEOUS) ×3 IMPLANT
SYR CONTROL 10ML LL (SYRINGE) IMPLANT
TAPE CLOTH 4X10 WHT NS (GAUZE/BANDAGES/DRESSINGS) ×3 IMPLANT
TAPE CLOTH SURG 4X10 WHT LF (GAUZE/BANDAGES/DRESSINGS) ×3 IMPLANT
TOWEL OR 17X26 10 PK STRL BLUE (TOWEL DISPOSABLE) ×3 IMPLANT
TOWEL OR NON WOVEN STRL DISP B (DISPOSABLE) ×3 IMPLANT
TUBING CONNECTING 10 (TUBING) ×2 IMPLANT
TUBING CONNECTING 10' (TUBING) ×1

## 2015-09-27 NOTE — Op Note (Signed)
09/26/2015 - 09/27/2015  11:14 AM  PATIENT:  Sandra Flowers  82 y.o. female  Patient Care Team: Merlene Laughter, MD as PCP - General (Internal Medicine)  PRE-OPERATIVE DIAGNOSIS:  decubitus ulcer  POST-OPERATIVE DIAGNOSIS:  decubitus ulcer  PROCEDURE:  DEBRIDMENT OF DECUBITUS ULCER  SURGEON:  Surgeon(s): Leighton Ruff, MD  ASSISTANT: none   ANESTHESIA:   general  EBL: 57ml  SPECIMEN:  Source of Specimen:  periostium  DISPOSITION OF SPECIMEN:  Micro  COUNTS:  YES  PLAN OF CARE: Patient admitted  PATIENT DISPOSITION:  PACU - hemodynamically stable.  INDICATION: 82 year old female with large decubitus ulcer frank necrosis of external skin who presents to the hospital with worsening sepsis. We will consult did to help with debridement of her devitalized tissue.  Frequency of debridement: unknown Area of body debrided: presacrum Presence and extent of infected tissue: 12x12x3cm Presence and extent of non viable tissue: 10x7x3cm  OR FINDINGS: Significant area of necrosis of skin with large cavity underneath. Necrosis of skin, subcutaneous tissue and fat, muscle  DESCRIPTION: The patient was identified in the pre op holding area and taken to the OR, where they were laid supine on the OR table.  General anesthesia was induced.  The patient was then placed on her side with a beanbag. The operative area was prepped and draped in the usual sterile fashion.  A surgical time out was performed, indicating the correct patient, procedure, positioning and pre-operative antibiotics.   After this was completed, the wound was measured to be 10x17x3cm.  The total area of devitalized tissue was 12x12x3cm.  A bovie instrument was used to debride the wound.  Debridement was carried down to the level of periostium.  Skin, fat and muscle tissue was removed.  There was infected and necrotic material in the wound that would inhibit healing and or promote adjacent tissue breakdown.  At the end of  the procedure the debrided area measured 12x12x4cm.  The wound was then packed with Kerlix sponge and covered with a sterile dressing.  The patient was awakened from anesthesia and sent to the PACU in stable condition.  All counts were correct per OR staff.

## 2015-09-27 NOTE — Progress Notes (Signed)
Decubitus Ulcer  Subjective: Pt feeling a bit better today.  Objective: Vital signs in last 24 hours: Temp:  [98 F (36.7 C)-99.1 F (37.3 C)] 98.3 F (36.8 C) (03/17 0840) Pulse Rate:  [74-109] 103 (03/17 0840) Resp:  [14-20] 16 (03/17 0840) BP: (88-120)/(41-62) 120/62 mmHg (03/17 0840) SpO2:  [94 %-100 %] 100 % (03/17 0840) Weight:  [72.576 kg (160 lb)-72.6 kg (160 lb 0.9 oz)] 72.6 kg (160 lb 0.9 oz) (03/16 1732) Last BM Date: 09/24/15  Intake/Output from previous day: 03/16 0701 - 03/17 0700 In: 1050 [I.V.:950; IV Piggyback:100] Out: 700 [Urine:700] Intake/Output this shift:    General appearance: alert and cooperative GI: normal findings: soft, non-tender Incision/Wound: no changes since previous exam  Lab Results:  Results for orders placed or performed during the hospital encounter of 09/26/15 (from the past 24 hour(s))  I-Stat Chem 8, ED     Status: Abnormal   Collection Time: 09/26/15 11:38 AM  Result Value Ref Range   Sodium 139 135 - 145 mmol/L   Potassium 3.6 3.5 - 5.1 mmol/L   Chloride 102 101 - 111 mmol/L   BUN 39 (H) 6 - 20 mg/dL   Creatinine, Ser 1.00 0.44 - 1.00 mg/dL   Glucose, Bld 124 (H) 65 - 99 mg/dL   Calcium, Ion 1.13 1.13 - 1.30 mmol/L   TCO2 23 0 - 100 mmol/L   Hemoglobin 7.8 (L) 12.0 - 15.0 g/dL   HCT 23.0 (L) 36.0 - 46.0 %  Comprehensive metabolic panel     Status: Abnormal   Collection Time: 09/26/15 12:25 PM  Result Value Ref Range   Sodium 143 135 - 145 mmol/L   Potassium 3.6 3.5 - 5.1 mmol/L   Chloride 106 101 - 111 mmol/L   CO2 23 22 - 32 mmol/L   Glucose, Bld 125 (H) 65 - 99 mg/dL   BUN 45 (H) 6 - 20 mg/dL   Creatinine, Ser 1.17 (H) 0.44 - 1.00 mg/dL   Calcium 8.9 8.9 - 10.3 mg/dL   Total Protein 6.2 (L) 6.5 - 8.1 g/dL   Albumin 2.4 (L) 3.5 - 5.0 g/dL   AST 37 15 - 41 U/L   ALT 19 14 - 54 U/L   Alkaline Phosphatase 97 38 - 126 U/L   Total Bilirubin 0.5 0.3 - 1.2 mg/dL   GFR calc non Af Amer 40 (L) >60 mL/min   GFR calc Af  Amer 47 (L) >60 mL/min   Anion gap 14 5 - 15  CBC with Differential     Status: Abnormal   Collection Time: 09/26/15 12:25 PM  Result Value Ref Range   WBC 13.1 (H) 4.0 - 10.5 K/uL   RBC 2.56 (L) 3.87 - 5.11 MIL/uL   Hemoglobin 7.8 (L) 12.0 - 15.0 g/dL   HCT 24.3 (L) 36.0 - 46.0 %   MCV 94.9 78.0 - 100.0 fL   MCH 30.5 26.0 - 34.0 pg   MCHC 32.1 30.0 - 36.0 g/dL   RDW 13.8 11.5 - 15.5 %   Platelets 302 150 - 400 K/uL   Neutrophils Relative % 88 %   Lymphocytes Relative 7 %   Monocytes Relative 5 %   Eosinophils Relative 0 %   Basophils Relative 0 %   Neutro Abs 11.5 (H) 1.7 - 7.7 K/uL   Lymphs Abs 0.9 0.7 - 4.0 K/uL   Monocytes Absolute 0.7 0.1 - 1.0 K/uL   Eosinophils Absolute 0.0 0.0 - 0.7 K/uL   Basophils Absolute 0.0  0.0 - 0.1 K/uL   RBC Morphology POLYCHROMASIA PRESENT   Urinalysis, Routine w reflex microscopic     Status: Abnormal   Collection Time: 09/26/15  2:09 PM  Result Value Ref Range   Color, Urine AMBER (A) YELLOW   APPearance CLOUDY (A) CLEAR   Specific Gravity, Urine 1.019 1.005 - 1.030   pH 5.0 5.0 - 8.0   Glucose, UA NEGATIVE NEGATIVE mg/dL   Hgb urine dipstick TRACE (A) NEGATIVE   Bilirubin Urine NEGATIVE NEGATIVE   Ketones, ur NEGATIVE NEGATIVE mg/dL   Protein, ur 30 (A) NEGATIVE mg/dL   Nitrite NEGATIVE NEGATIVE   Leukocytes, UA LARGE (A) NEGATIVE  Urine microscopic-add on     Status: Abnormal   Collection Time: 09/26/15  2:09 PM  Result Value Ref Range   Squamous Epithelial / LPF 0-5 (A) NONE SEEN   WBC, UA TOO NUMEROUS TO COUNT 0 - 5 WBC/hpf   RBC / HPF 0-5 0 - 5 RBC/hpf   Bacteria, UA FEW (A) NONE SEEN   Casts GRANULAR CAST (A) NEGATIVE   Urine-Other AMORPHOUS URATES/PHOSPHATES   Basic metabolic panel     Status: Abnormal   Collection Time: 09/27/15  3:39 AM  Result Value Ref Range   Sodium 141 135 - 145 mmol/L   Potassium 3.7 3.5 - 5.1 mmol/L   Chloride 107 101 - 111 mmol/L   CO2 23 22 - 32 mmol/L   Glucose, Bld 89 65 - 99 mg/dL   BUN 42  (H) 6 - 20 mg/dL   Creatinine, Ser 1.08 (H) 0.44 - 1.00 mg/dL   Calcium 8.5 (L) 8.9 - 10.3 mg/dL   GFR calc non Af Amer 44 (L) >60 mL/min   GFR calc Af Amer 52 (L) >60 mL/min   Anion gap 11 5 - 15  Vitamin B12     Status: None   Collection Time: 09/27/15  3:39 AM  Result Value Ref Range   Vitamin B-12 313 180 - 914 pg/mL  Folate     Status: None   Collection Time: 09/27/15  3:39 AM  Result Value Ref Range   Folate 20.9 >5.9 ng/mL  Iron and TIBC     Status: Abnormal   Collection Time: 09/27/15  3:39 AM  Result Value Ref Range   Iron 20 (L) 28 - 170 ug/dL   TIBC 130 (L) 250 - 450 ug/dL   Saturation Ratios 15 10.4 - 31.8 %   UIBC 110 ug/dL  Ferritin     Status: Abnormal   Collection Time: 09/27/15  3:39 AM  Result Value Ref Range   Ferritin 352 (H) 11 - 307 ng/mL  Reticulocytes     Status: Abnormal   Collection Time: 09/27/15  3:39 AM  Result Value Ref Range   Retic Ct Pct 1.3 0.4 - 3.1 %   RBC. 2.08 (L) 3.87 - 5.11 MIL/uL   Retic Count, Manual 27.0 19.0 - 186.0 K/uL  Prealbumin     Status: Abnormal   Collection Time: 09/27/15  3:39 AM  Result Value Ref Range   Prealbumin 5.8 (L) 18 - 38 mg/dL     Studies/Results Radiology     MEDS, Scheduled . [MAR Hold] dipyridamole-aspirin  1 capsule Oral BID  . [MAR Hold] enoxaparin (LOVENOX) injection  40 mg Subcutaneous Q24H  . [MAR Hold] feeding supplement (PRO-STAT SUGAR FREE 64)  30 mL Oral TID  . [MAR Hold] piperacillin-tazobactam (ZOSYN)  IV  3.375 g Intravenous Q8H  . [MAR Hold] saccharomyces  boulardii  250 mg Oral BID  . [MAR Hold] senna-docusate  2 tablet Oral QHS  . [MAR Hold] vancomycin  750 mg Intravenous Q24H  . [MAR Hold] vitamin C  500 mg Oral Daily  . [MAR Hold] zinc sulfate  220 mg Oral Daily     Assessment: Decubitus ulcer with skin necrosis and underlying abscess  Plan: OR today for I&D.  Risks include bleeding, continued infection, prolonged wound healing and need for further surgeries.      LOS: 1  day    Rosario Adie, MD [facility], [place]   09/27/2015 9:30 AM

## 2015-09-27 NOTE — Anesthesia Preprocedure Evaluation (Addendum)
Anesthesia Evaluation  Patient identified by MRN, date of birth, ID band Patient awake    Reviewed: Allergy & Precautions, NPO status , Patient's Chart, lab work & pertinent test results  History of Anesthesia Complications Negative for: history of anesthetic complications  Airway Mallampati: II  TM Distance: >3 FB Neck ROM: Full    Dental no notable dental hx. (+) Dental Advisory Given, Edentulous Upper, Missing, Poor Dentition   Pulmonary neg pulmonary ROS,    Pulmonary exam normal breath sounds clear to auscultation       Cardiovascular hypertension, Pt. on medications Normal cardiovascular exam Rhythm:Regular Rate:Normal     Neuro/Psych negative neurological ROS  negative psych ROS   GI/Hepatic negative GI ROS, Neg liver ROS,   Endo/Other  negative endocrine ROS  Renal/GU negative Renal ROS  negative genitourinary   Musculoskeletal  (+) Arthritis ,   Abdominal   Peds negative pediatric ROS (+)  Hematology negative hematology ROS (+)   Anesthesia Other Findings Nonhealing decubitus ulcer, bed bound for about a month after fall  Reproductive/Obstetrics negative OB ROS                          Anesthesia Physical Anesthesia Plan  ASA: III  Anesthesia Plan: General   Post-op Pain Management:    Induction: Intravenous  Airway Management Planned: Oral ETT  Additional Equipment:   Intra-op Plan:   Post-operative Plan: Extubation in OR and Possible Post-op intubation/ventilation  Informed Consent: I have reviewed the patients History and Physical, chart, labs and discussed the procedure including the risks, benefits and alternatives for the proposed anesthesia with the patient or authorized representative who has indicated his/her understanding and acceptance.   Dental advisory given  Plan Discussed with: CRNA  Anesthesia Plan Comments: (Discussed care with the daughter, all  questions answers and concerns addressed)      Anesthesia Quick Evaluation

## 2015-09-27 NOTE — Transfer of Care (Signed)
Immediate Anesthesia Transfer of Care Note  Patient: Sandra Flowers  Procedure(s) Performed: Procedure(s): DEBRIDMENT OF DECUBITUS ULCER (N/A)  Patient Location: PACU  Anesthesia Type:General  Level of Consciousness:  sedated, patient cooperative and responds to stimulation  Airway & Oxygen Therapy:Patient Spontanous Breathing and Patient connected to face mask oxgen  Post-op Assessment:  Report given to PACU RN and Post -op Vital signs reviewed and stable  Post vital signs:  Reviewed and stable  Last Vitals:  Filed Vitals:   09/27/15 0505 09/27/15 0840  BP: 88/42 120/62  Pulse: 94 103  Temp: 37.1 C 36.8 C  Resp: 16 16    Complications: No apparent anesthesia complications

## 2015-09-27 NOTE — Progress Notes (Signed)
Patient ID: Sandra Flowers, female   DOB: 05-Sep-1924, 82 y.o.   MRN: [id number] [facility] PROGRESS NOTE  Sandra Flowers [id number] DOB: [birth year] DOA: 09/26/2015 PCP: Merlene Laughter, MD  Brief narrative:    82 year old female with past medical history of hypertension, wheelchair bound, sacral decubitus ulcer and per family sacral decubitus wound has become progressively worse in last 2 weeks prior to this admission. Patient was hemodynamically stable on the admission. She was seen by surgery in consultation and plan is for I&D. Continue current antibiotics, vancomycin and Zosyn.  Assessment/Plan:    Principal Problem: Infected Sacral Pressure Ulcer (UnstageableStage 4 with tunneling)  - Appreciate surgery following, plan for debridement today - Continue current abx, vanco and zosyn - Continue pain management efforts   Active Problems: UTI / Leukocytosis - Urinalysis on the admission with large leukocytes and too numerous to count white blood cells - Urine culture is pending - Continue current antibiotics  Chronic kidney disease stage III - Considering the degree of anemia patient most likely have chronic kidney disease rather than acute kidney injury however we have no previous values for comparison - Creatinine mildly elevated at 1.08- - Hold Hyzaar for now   Anemia of chronic disease  - Likely from long-term antiplatelet therapy  - Anemia panel shows low iron and low TIBC  - Transfuse if hemoglobin less than 7  - We'll start once a day iron supplementation    DVT prophylaxis - Stop lovenox and aggrenox due to anemia, hemoglobin is 7.8   Code Status: DNR/DNI Family Communication:  plan of care discussed with the patient Disposition Plan: d/c likely by 3/20  IV access:  Peripheral IV  Procedures and diagnostic studies:    No results found.  Medical Consultants:  Surgery  Other Consultants:  WOC  IAnti-Infectives:   vanco and zosyn 09/26/2015  -->   Leisa Lenz, MD  [facility] Pager [phone]  Time spent in minutes: 25 minutes  If 7PM-7AM, please contact night-coverage [email or web address] Password [email or web address] 09/27/2015, 7:04 AM   LOS: 1 day    HPI/Subjective: No acute overnight events. Patient reports uncontrolled pain.  Objective: Filed Vitals:   09/26/15 1708 09/26/15 1732 09/26/15 2142 09/27/15 0505  BP: 93/42 101/44 98/41 88/42   Pulse: 85 74 99 94  Temp: 98 F (36.7 C) 99.1 F (37.3 C) 98.6 F (37 C) 98.7 F (37.1 C)  TempSrc: Oral Oral Oral Oral  Resp: 18 16 20 16   Height:  5\' 7"  (1.702 m)    Weight:  72.6 kg (160 lb 0.9 oz)    SpO2: 100% 100% 95% 97%    Intake/Output Summary (Last 24 hours) at 09/27/15 0704 Last data filed at 09/27/15 0600  Gross per 24 hour  Intake   1050 ml  Output    700 ml  Net    350 ml    Exam:   General:  Pt is alert, follows commands appropriately, not in acute distress  Cardiovascular: Regular rate and rhythm, S1/S2 appreciated   Respiratory: Clear to auscultation bilaterally, no wheezing, no crackles, no rhonchi  Abdomen: Soft, non tender, non distended, bowel sounds present  Extremities: No edema, pulses palpable bilaterally  Neuro: Grossly nonfocal  Data Reviewed: Basic Metabolic Panel:  Recent Labs Lab 09/26/15 1138 09/26/15 1225 09/27/15 0339  NA 139 143 141  K 3.6 3.6 3.7  CL 102 106 107  CO2  --  23 23  GLUCOSE 124* 125* 89  BUN 39* 45* 42*  CREATININE 1.00 1.17* 1.08*  CALCIUM  --  8.9 8.5*   Liver Function Tests:  Recent Labs Lab 09/26/15 1225  AST 37  ALT 19  ALKPHOS 97  BILITOT 0.5  PROT 6.2*  ALBUMIN 2.4*   No results for input(s): LIPASE, AMYLASE in the last 168 hours. No results for input(s): AMMONIA in the last 168 hours. CBC:  Recent Labs Lab 09/26/15 1138 09/26/15 1225  WBC  --  13.1*  NEUTROABS  --  11.5*  HGB 7.8* 7.8*  HCT 23.0* 24.3*  MCV  --  94.9  PLT  --  302   Cardiac Enzymes: No results for input(s):  CKTOTAL, CKMB, CKMBINDEX, TROPONINI in the last 168 hours. BNP: Invalid input(s): POCBNP CBG: No results for input(s): GLUCAP in the last 168 hours.  Recent Results (from the past 240 hour(s))  Culture, Urine     Status: None   Collection Time: 09/20/15  8:15 AM  Result Value Ref Range Status   Specimen Description URINE, CLEAN CATCH  Final   Special Requests NONE  Final   Culture   Final    MULTIPLE SPECIES PRESENT, SUGGEST RECOLLECTION Performed at [facility]    Report Status 09/22/2015 FINAL  Final     Scheduled Meds: . dipyridamole-aspirin  1 capsule Oral BID  . enoxaparin (LOVENOX) injection  40 mg Subcutaneous Q24H  . feeding supplement (PRO-STAT SUGAR FREE 64)  30 mL Oral TID  . piperacillin-tazobactam (ZOSYN)  IV  3.375 g Intravenous Q8H  . saccharomyces boulardii  250 mg Oral BID  . senna-docusate  2 tablet Oral QHS  . vancomycin  750 mg Intravenous Q24H  . vitamin C  500 mg Oral Daily  . zinc sulfate  220 mg Oral Daily   Continuous Infusions: . sodium chloride

## 2015-09-27 NOTE — Consult Note (Addendum)
WOC consult requested prior to surgical team involvement for unstageable sacral/buttocks pressure injury.  They are now following for assessment and plan of care and patient is going to the OR today for debridement.  Please refer to their team for further questions. Please re-consult if further assistance is needed.  Thank-you,  Julien Girt MSN, [place], [place], [place], [place]

## 2015-09-27 NOTE — Anesthesia Postprocedure Evaluation (Signed)
Anesthesia Post Note  Patient: Laurene Bartles  Procedure(s) Performed: Procedure(s) (LRB): DEBRIDMENT OF DECUBITUS ULCER (N/A)  Patient location during evaluation: PACU Anesthesia Type: General Level of consciousness: awake and alert Pain management: pain level controlled Vital Signs Assessment: post-procedure vital signs reviewed and stable Respiratory status: spontaneous breathing, nonlabored ventilation, respiratory function stable and patient connected to nasal cannula oxygen Cardiovascular status: blood pressure returned to baseline and stable Postop Assessment: no signs of nausea or vomiting Anesthetic complications: no    Last Vitals:  Filed Vitals:   09/27/15 1215 09/27/15 1227  BP: 109/53 96/52  Pulse: 84 84  Temp: 36.3 C 36.4 C  Resp: 10 12    Last Pain:  Filed Vitals:   09/27/15 1231  PainSc: Asleep                 Amyla Heffner JENNETTE

## 2015-09-27 NOTE — Anesthesia Procedure Notes (Signed)
Procedure Name: Intubation Date/Time: 09/27/2015 10:16 AM Performed by: Dione Booze Pre-anesthesia Checklist: Emergency Drugs available, Suction available, Patient being monitored and Patient identified Patient Re-evaluated:Patient Re-evaluated prior to inductionOxygen Delivery Method: Circle system utilized Preoxygenation: Pre-oxygenation with 100% oxygen Intubation Type: IV induction Ventilation: Mask ventilation without difficulty Laryngoscope Size: Mac and 4 Grade View: Grade I Tube type: Subglottic suction tube Tube size: 7.5 mm Number of attempts: 1 Airway Equipment and Method: Stylet Secured at: 21 cm Tube secured with: Tape Dental Injury: Teeth and Oropharynx as per pre-operative assessment

## 2015-09-28 MED ORDER — PERFLUTREN LIPID MICROSPHERE
1.0000 mL | INTRAVENOUS | Status: DC | PRN
  Filled 2015-09-28: qty 10

## 2015-09-28 MED ORDER — FERROUS SULFATE 325 (65 FE) MG PO TABS
325.0000 mg | ORAL_TABLET | Freq: Every day | ORAL | Status: DC
  Administered 2015-09-29 – 2015-10-03 (×5): 325 mg via ORAL
  Filled 2015-09-28 (×5): qty 1

## 2015-09-28 NOTE — Progress Notes (Signed)
Patient ID: Sandra Flowers, female   DOB: 11-26-1924, 82 y.o.   MRN: [id number] [facility] PROGRESS NOTE  Sandra Flowers [id number] DOB: 18-Nov-1924 DOA: 09/26/2015 PCP: Merlene Laughter, MD  Brief narrative:    82 year old female with past medical history of hypertension, wheelchair bound, sacral decubitus ulcer and per family sacral decubitus wound has become progressively worse in last 2 weeks prior to this admission. Patient was hemodynamically stable on the admission. She was seen by surgery in consultation and she underwent I&D 3/17.  Assessment/Plan:    Principal Problem: Infected Sacral Pressure Ulcer (UnstageableStage 4 with tunneling)  - S/P I&D [id number] by Dr. Leighton Ruff - Continue vanco and zosyn - Continue pain management   Active Problems: UTI / Leukocytosis - Urinalysis on the admission showed large leukocytes and too numerous to count white blood cells - Urine culture with insignificant growth - Pt on abx for infected wound   Chronic kidney disease stage III - Considering the degree of anemia patient most likely has chronic kidney disease rather than acute kidney injury however we have no previous values for comparison - Creatinine 1.08 - Follow BMP in am - Hyzaar on hold due to CKD and due to hypotension   Anemia of chronic disease  - Likely from long-term antiplatelet therapy  - Anemia panel shows low iron and low TIBC  - Transfuse if hemoglobin less than 7  - Started ferrous sulfate 325 mg daily  - Check CBC in am   DVT prophylaxis - Stopped lovenox and aggrenox due to anemia - Using SCD's for DVT prophylaxis   Code Status: DNR/DNI Family Communication:  plan of care discussed with the patient Disposition Plan: d/c likely by 3/20  IV access:  Peripheral IV  Procedures and diagnostic studies:    Sacral decubitus ulcer debridement [id number] by Dr. Leighton Ruff  Medical Consultants:  Surgery  Other Consultants:  WOC  IAnti-Infectives:    vanco and zosyn 09/26/2015 -->   Leisa Lenz, MD  [facility] Pager [phone]  Time spent in minutes: 25 minutes  If 7PM-7AM, please contact night-coverage [email or web address] Password [facility] 09/28/2015, 12:13 PM   LOS: 2 days    HPI/Subjective: No acute overnight events.   Objective: Filed Vitals:   09/27/15 1227 09/27/15 1541 09/27/15 2100 09/28/15 0546  BP: 96/52 102/64 88/47 106/51  Pulse: 84 80 88 86  Temp: 97.5 F (36.4 C) 97.2 F (36.2 C) 97.9 F (36.6 C) 98.8 F (37.1 C)  TempSrc:  Oral Oral Oral  Resp: 12 16 16 14   Height:      Weight:      SpO2: 99% 90% 99% 98%    Intake/Output Summary (Last 24 hours) at 09/28/15 1213 Last data filed at 09/28/15 0700  Gross per 24 hour  Intake   1310 ml  Output   1075 ml  Net    235 ml    Exam:   General:  Pt is sleeping, not in acute distress  Cardiovascular: Rate controlled, S1/S2 (+)  Respiratory: No wheezing, no crackles, no rhonchi  Abdomen: (+) BS, non tender   Extremities: No swelling, palpable pulses   Neuro: Nonfocal  Data Reviewed: Basic Metabolic Panel:  Recent Labs Lab 09/26/15 1138 09/26/15 1225 09/27/15 0339  NA 139 143 141  K 3.6 3.6 3.7  CL 102 106 107  CO2  --  23 23  GLUCOSE 124* 125* 89  BUN 39* 45* 42*  CREATININE 1.00 1.17* 1.08*  CALCIUM  --  8.9 8.5*   Liver Function Tests:  Recent Labs Lab 09/26/15 1225  AST 37  ALT 19  ALKPHOS 97  BILITOT 0.5  PROT 6.2*  ALBUMIN 2.4*   No results for input(s): LIPASE, AMYLASE in the last 168 hours. No results for input(s): AMMONIA in the last 168 hours. CBC:  Recent Labs Lab 09/26/15 1138 09/26/15 1225  WBC  --  13.1*  NEUTROABS  --  11.5*  HGB 7.8* 7.8*  HCT 23.0* 24.3*  MCV  --  94.9  PLT  --  302   Cardiac Enzymes: No results for input(s): CKTOTAL, CKMB, CKMBINDEX, TROPONINI in the last 168 hours. BNP: Invalid input(s): POCBNP CBG: No results for input(s): GLUCAP in the last 168 hours.  Recent Results  (from the past 240 hour(s))  Culture, Urine     Status: None   Collection Time: 09/20/15  8:15 AM  Result Value Ref Range Status   Specimen Description URINE, CLEAN CATCH  Final   Special Requests NONE  Final   Culture   Final    MULTIPLE SPECIES PRESENT, SUGGEST RECOLLECTION Performed at [facility]    Report Status 09/22/2015 FINAL  Final  Urine culture     Status: None   Collection Time: 09/26/15  2:09 PM  Result Value Ref Range Status   Specimen Description URINE, CATHETERIZED  Final   Special Requests NONE  Final   Culture   Final    2,000 COLONIES/mL INSIGNIFICANT GROWTH Performed at [facility]    Report Status 09/27/2015 FINAL  Final  MRSA PCR Screening     Status: None   Collection Time: 09/27/15  4:31 PM  Result Value Ref Range Status   MRSA by PCR NEGATIVE NEGATIVE Final    Comment:        The GeneXpert MRSA Assay (FDA approved for NASAL specimens only), is one component of a comprehensive MRSA colonization surveillance program. It is not intended to diagnose MRSA infection nor to guide or monitor treatment for MRSA infections.      Scheduled Meds: . feeding supplement (PRO-STAT SUGAR FREE 64)  30 mL Oral TID  . piperacillin-tazobactam (ZOSYN)  IV  3.375 g Intravenous Q8H  . saccharomyces boulardii  250 mg Oral BID  . senna-docusate  2 tablet Oral QHS  . vancomycin  750 mg Intravenous Q24H  . vitamin C  500 mg Oral Daily  . zinc sulfate  220 mg Oral Daily   Continuous Infusions: . sodium chloride 75 mL/hr at 09/27/15 1652

## 2015-09-28 NOTE — Progress Notes (Signed)
1 Day Post-Op debridement of decubitus ulcer Subjective: Did well with dressing change medicated.    Objective: Vital signs in last 24 hours: Temp:  [97.2 F (36.2 C)-98.8 F (37.1 C)] 98.8 F (37.1 C) (03/18 0546) Pulse Rate:  [80-103] 86 (03/18 0546) Resp:  [10-21] 14 (03/18 0546) BP: (88-137)/(47-78) 106/51 mmHg (03/18 0546) SpO2:  [90 %-100 %] 98 % (03/18 0546)   Intake/Output from previous day: 03/17 0701 - 03/18 0700 In: 2010 [I.V.:1760; IV Piggyback:250] Out: 1175 [Urine:875; Blood:300] Intake/Output this shift:     General appearance: alert and cooperative GI: normal findings: soft, non-tender  Incision: dressing clean and dry  Lab Results:   Recent Labs  09/26/15 1138 09/26/15 1225  WBC  --  13.1*  HGB 7.8* 7.8*  HCT 23.0* 24.3*  PLT  --  302   BMET  Recent Labs  09/26/15 1225 09/27/15 0339  NA 143 141  K 3.6 3.7  CL 106 107  CO2 23 23  GLUCOSE 125* 89  BUN 45* 42*  CREATININE 1.17* 1.08*  CALCIUM 8.9 8.5*   PT/INR No results for input(s): LABPROT, INR in the last 72 hours. ABG No results for input(s): PHART, HCO3 in the last 72 hours.  Invalid input(s): PCO2, PO2  MEDS, Scheduled . feeding supplement (PRO-STAT SUGAR FREE 64)  30 mL Oral TID  . piperacillin-tazobactam (ZOSYN)  IV  3.375 g Intravenous Q8H  . saccharomyces boulardii  250 mg Oral BID  . senna-docusate  2 tablet Oral QHS  . vancomycin  750 mg Intravenous Q24H  . vitamin C  500 mg Oral Daily  . zinc sulfate  220 mg Oral Daily    Studies/Results: No results found.  Assessment: s/p Procedure(s): DEBRIDMENT OF DECUBITUS ULCER Patient Active Problem List   Diagnosis Date Noted  . Decubitus ulcer, infected 09/26/2015  . Meningioma ([place])   . Hearing loss       Plan: Cont wound care: Dressing changes BID and PRN.  Keep wound free of stool Will have wound RN eval for any further topical treatments   LOS: 2 days     .Rosario Adie, [place]  [facility], [place]   09/28/2015 8:01 AM

## 2015-09-29 DIAGNOSIS — D62 Acute posthemorrhagic anemia: Secondary | ICD-10-CM

## 2015-09-29 LAB — BASIC METABOLIC PANEL
ANION GAP: 9 (ref 5–15)
BUN: 43 mg/dL — AB (ref 6–20)
CALCIUM: 8.6 mg/dL — AB (ref 8.9–10.3)
CO2: 24 mmol/L (ref 22–32)
Chloride: 110 mmol/L (ref 101–111)
Creatinine, Ser: 0.99 mg/dL (ref 0.44–1.00)
GFR calc Af Amer: 57 mL/min — ABNORMAL LOW (ref >60)
GFR, EST NON AFRICAN AMERICAN: 49 mL/min — AB (ref >60)
GLUCOSE: 87 mg/dL (ref 65–99)
POTASSIUM: 3.6 mmol/L (ref 3.5–5.1)
SODIUM: 143 mmol/L (ref 135–145)

## 2015-09-29 LAB — VANCOMYCIN, TROUGH: VANCOMYCIN TR: 11 ug/mL (ref 10.0–20.0)

## 2015-09-29 LAB — CBC
HCT: 21 % — ABNORMAL LOW (ref 36.0–46.0)
Hemoglobin: 6.4 g/dL — CL (ref 12.0–15.0)
MCH: 30.9 pg (ref 26.0–34.0)
MCHC: 30.5 g/dL (ref 30.0–36.0)
MCV: 101.4 fL — ABNORMAL HIGH (ref 78.0–100.0)
PLATELETS: 262 10*3/uL (ref 150–400)
RBC: 2.07 MIL/uL — AB (ref 3.87–5.11)
RDW: 14.3 % (ref 11.5–15.5)
WBC: 11.1 10*3/uL — AB (ref 4.0–10.5)

## 2015-09-29 LAB — PREPARE RBC (CROSSMATCH)

## 2015-09-29 MED ORDER — SODIUM CHLORIDE 0.9 % IV SOLN: Freq: Once | INTRAVENOUS | Status: DC

## 2015-09-29 MED ORDER — VANCOMYCIN HCL 10 G IV SOLR
1250.0000 mg | INTRAVENOUS | Status: DC
  Administered 2015-09-29: 1250 mg via INTRAVENOUS
  Filled 2015-09-29 (×2): qty 1250

## 2015-09-29 NOTE — Progress Notes (Addendum)
Patient ID: Sandra Flowers, female   DOB: 01-08-1925, 82 y.o.   MRN: [id number] [facility] PROGRESS NOTE  Tonnetta Bunney [id number] DOB: 1925-07-08 DOA: 09/26/2015 PCP: Merlene Laughter, MD  Brief narrative:    82 year old female with past medical history of hypertension, wheelchair bound, sacral decubitus ulcer and per family sacral decubitus wound has become progressively worse in last 2 weeks prior to this admission. Patient was hemodynamically stable on the admission. She was seen by surgery in consultation and she underwent I&D 3/17.  Assessment/Plan:    Principal Problem: Infected Sacral Pressure Ulcer (UnstageableStage 4 with tunneling)  - S/P I&D [id number] by Dr. Leighton Ruff - Continue vanco and zosyn for next 24 hours and transition to by mouth antibiotics prior to discharge. - Patient reports pain is controlled.  Active Problems: UTI / Leukocytosis - Urinalysis on the admission showed large leukocytes and too numerous to count white blood cells - Urine culture with insignificant growth  Chronic kidney disease stage III  - Considering the degree of anemia patient most likely has chronic kidney disease rather than acute kidney injury however we have no previous values for comparison - Creatinine 1.17 on 3/16. Creatinine improved with hydration.  Anemia of chronic disease / acute blood loss anemia - Likely from long-term antiplatelet therapy  - Hemoglobin drop noted in past 72 hours from 7.8 down to 6.4. Likely from recent procedure.  - Current anemia panel does show low iron and low TIBC and we did start low-dose ferrous sulfate supplementation - Will transfuse 2 units of PRBC today   DVT prophylaxis - Stopped lovenox and aggrenox due to anemia - Continue SCDs bilaterally  Code Status: DNR/DNI Family Communication:  plan of care discussed with the patient Disposition Plan: d/c likely by 3/20  IV access:  Peripheral IV  Procedures and diagnostic studies:     Sacral decubitus ulcer debridement [id number] by Dr. Leighton Ruff  Medical Consultants:  Surgery  Other Consultants:  WOC  IAnti-Infectives:   vanco and zosyn 09/26/2015 -->   Leisa Lenz, MD  [facility] Pager [phone]  Time spent in minutes: 25 minutes  If 7PM-7AM, please contact night-coverage [email or web address] Password [email or web address] 09/29/2015, 10:38 AM   LOS: 3 days    HPI/Subjective: No acute overnight events. Patient says she feels little bit better this morning.  Objective: Filed Vitals:   09/28/15 0546 09/28/15 1225 09/28/15 2138 09/29/15 0552  BP: 106/51 109/60 86/46 98/45   Pulse: 86 84 92 90  Temp: 98.8 F (37.1 C) 98 F (36.7 C) 99.7 F (37.6 C) 97.6 F (36.4 C)  TempSrc: Oral Oral Oral Oral  Resp: 14 16 16 20   Height:      Weight:      SpO2: 98% 97% 100% 97%    Intake/Output Summary (Last 24 hours) at 09/29/15 1038 Last data filed at 09/29/15 0552  Gross per 24 hour  Intake    360 ml  Output   1025 ml  Net   -665 ml    Exam:   General:  Pt is alert, no distress  Cardiovascular: RRR, appreciate S1-S2  Respiratory: Bilateral air entry, no wheezing  Abdomen: Nontender abdomen, appreciate bowel sounds  Extremities: No edema, appreciate pulses bilaterally  Neuro: No focal deficits  Data Reviewed: Basic Metabolic Panel:  Recent Labs Lab 09/26/15 1138 09/26/15 1225 09/27/15 0339 09/29/15 0506  NA 139 143 141 143  K 3.6 3.6 3.7 3.6  CL 102 106 107 110  CO2  --  23 23 24   GLUCOSE 124* 125* 89 87  BUN 39* 45* 42* 43*  CREATININE 1.00 1.17* 1.08* 0.99  CALCIUM  --  8.9 8.5* 8.6*   Liver Function Tests:  Recent Labs Lab 09/26/15 1225  AST 37  ALT 19  ALKPHOS 97  BILITOT 0.5  PROT 6.2*  ALBUMIN 2.4*   No results for input(s): LIPASE, AMYLASE in the last 168 hours. No results for input(s): AMMONIA in the last 168 hours. CBC:  Recent Labs Lab 09/26/15 1138 09/26/15 1225 09/29/15 0506  WBC  --  13.1* 11.1*  NEUTROABS   --  11.5*  --   HGB 7.8* 7.8* 6.4*  HCT 23.0* 24.3* 21.0*  MCV  --  94.9 101.4*  PLT  --  302 262   Cardiac Enzymes: No results for input(s): CKTOTAL, CKMB, CKMBINDEX, TROPONINI in the last 168 hours. BNP: Invalid input(s): POCBNP CBG: No results for input(s): GLUCAP in the last 168 hours.  Recent Results (from the past 240 hour(s))  Culture, Urine     Status: None   Collection Time: 09/20/15  8:15 AM  Result Value Ref Range Status   Specimen Description URINE, CLEAN CATCH  Final   Special Requests NONE  Final   Culture   Final    MULTIPLE SPECIES PRESENT, SUGGEST RECOLLECTION Performed at [facility]    Report Status 09/22/2015 FINAL  Final  Urine culture     Status: None   Collection Time: 09/26/15  2:09 PM  Result Value Ref Range Status   Specimen Description URINE, CATHETERIZED  Final   Special Requests NONE  Final   Culture   Final    2,000 COLONIES/mL INSIGNIFICANT GROWTH Performed at [facility]    Report Status 09/27/2015 FINAL  Final  Tissue culture     Status: None (Preliminary result)   Collection Time: 09/27/15 10:50 AM  Result Value Ref Range Status   Specimen Description TISSUE DECUBITIS ULCER  Final   Special Requests NONE  Final   Gram Stain PENDING  Incomplete   Culture   Final    MODERATE MORGANELLA MORGANII Performed at [facility]    Report Status PENDING  Incomplete   Organism ID, Bacteria MORGANELLA MORGANII  Final      Susceptibility   Morganella morganii - MIC*    AMPICILLIN >=32 RESISTANT Resistant     AMPICILLIN/SULBACTAM 16 INTERMEDIATE Intermediate     CEFAZOLIN >=64 RESISTANT Resistant     CEFEPIME <=1 SENSITIVE Sensitive     CEFTAZIDIME <=1 SENSITIVE Sensitive     CEFTRIAXONE <=1 SENSITIVE Sensitive     CIPROFLOXACIN <=0.25 SENSITIVE Sensitive     GENTAMICIN <=1 SENSITIVE Sensitive     IMIPENEM 8 RESISTANT Resistant     PIP/TAZO <=4 SENSITIVE Sensitive     TOBRAMYCIN <=1 SENSITIVE Sensitive      TRIMETH/SULFA Value in next row Sensitive      <=20 SENSITIVE(NOTE)    * MODERATE MORGANELLA MORGANII  MRSA PCR Screening     Status: None   Collection Time: 09/27/15  4:31 PM  Result Value Ref Range Status   MRSA by PCR NEGATIVE NEGATIVE Final    Comment:        The GeneXpert MRSA Assay (FDA approved for NASAL specimens only), is one component of a comprehensive MRSA colonization surveillance program. It is not intended to diagnose MRSA infection nor to guide or monitor treatment for MRSA infections.      Scheduled Meds: . sodium chloride  Intravenous Once  . feeding supplement (PRO-STAT SUGAR FREE 64)  30 mL Oral TID  . ferrous sulfate  325 mg Oral Q breakfast  . piperacillin-tazobactam (ZOSYN)  IV  3.375 g Intravenous Q8H  . saccharomyces boulardii  250 mg Oral BID  . senna-docusate  2 tablet Oral QHS  . vancomycin  750 mg Intravenous Q24H  . vitamin C  500 mg Oral Daily  . zinc sulfate  220 mg Oral Daily   Continuous Infusions: . sodium chloride 75 mL/hr at 09/28/15 1751

## 2015-09-29 NOTE — NC FL2 (Signed)
[place] LEVEL OF CARE SCREENING TOOL     IDENTIFICATION  Patient Name: Sandra Flowers Birthdate: Mar 25, 1925 Sex: female Admission Date (Current Location): 09/26/2015  [facility] and [place] Number:  Herbalist and Address:  [facility],  [place] [address], [place]      Provider Number: [id number]  Attending Physician Name and Address:  Robbie Lis, MD  Relative Name and Phone Number:       Current Level of Care: Hospital Recommended Level of Care: [place] Prior Approval Number:    Date Approved/Denied:   PASRR Number:    Discharge Plan: SNF    Current Diagnoses: Patient Active Problem List   Diagnosis Date Noted  . Decubitus ulcer, infected 09/26/2015  . Meningioma ([place])   . Hearing loss     Orientation RESPIRATION BLADDER Height & Weight     Self, Time, Situation, Place  Normal Indwelling catheter Weight: 72.6 kg (160 lb 0.9 oz) Height:  5\' 7"  (170.2 cm)  BEHAVIORAL SYMPTOMS/MOOD NEUROLOGICAL BOWEL NUTRITION STATUS  Other (Comment) (no behaviors)   Continent Diet  AMBULATORY STATUS COMMUNICATION OF NEEDS Skin   Extensive Assist Verbally Other (Comment) (Unstageable sacral/buttocks pressure injury. BID dressing  changes.)                       Personal Care Assistance Level of Assistance  Bathing, Feeding, Dressing Bathing Assistance: Maximum assistance Feeding assistance: Independent Dressing Assistance: Maximum assistance     Functional Limitations Info  Sight, Hearing, Speech Sight Info: Adequate Hearing Info: Impaired Speech Info: Adequate    SPECIAL CARE FACTORS FREQUENCY  PT (By licensed PT)                    Contractures Contractures Info: Not present    Additional Factors Info  Code Status Code Status Info: Full Code             Current Medications (09/29/2015):  This is the current hospital active medication list Current Facility-Administered Medications   Medication Dose Route Frequency Provider Last Rate Last Dose  . 0.9 %  sodium chloride infusion   Intravenous Continuous Leana Gamer, MD 75 mL/hr at 09/28/15 1751    . 0.9 %  sodium chloride infusion   Intravenous Once Robbie Lis, MD      . feeding supplement (PRO-STAT SUGAR FREE 64) liquid 30 mL  30 mL Oral TID Leana Gamer, MD   30 mL at 09/29/15 0953  . ferrous sulfate tablet 325 mg  325 mg Oral Q breakfast Robbie Lis, MD   325 mg at 09/29/15 0857  . lip balm (CARMEX) ointment   Topical PRN Leana Gamer, MD      . morphine 2 MG/ML injection 1 mg  1 mg Intravenous Q2H PRN Robbie Lis, MD   1 mg at 09/27/15 1735  . morphine 2 MG/ML injection 2-4 mg  2-4 mg Intravenous [id number] PRN Leighton Ruff, MD   4 mg at 09/29/15 0855  . oxyCODONE-acetaminophen (PERCOCET/ROXICET) 5-325 MG per tablet 1 tablet  1 tablet Oral Q8H PRN Leana Gamer, MD   1 tablet at 09/28/15 [phone]  . piperacillin-tazobactam (ZOSYN) IVPB 3.375 g  3.375 g Intravenous Q8H Christine E Shade, RPH   3.375 g at 09/29/15 1129  . polyvinyl alcohol (LIQUIFILM TEARS) 1.4 % ophthalmic solution 1 drop  1 drop Both Eyes PRN Leana Gamer, MD      .  saccharomyces boulardii (FLORASTOR) capsule 250 mg  250 mg Oral BID Leana Gamer, MD   250 mg at 09/29/15 0953  . senna-docusate (Senokot-S) tablet 2 tablet  2 tablet Oral QHS Leana Gamer, MD   2 tablet at 09/28/15 1957  . vancomycin (VANCOCIN) 1,250 mg in sodium chloride 0.9 % 250 mL IVPB  1,250 mg Intravenous Q24H Robbie Lis, MD      . vitamin C (ASCORBIC ACID) tablet 500 mg  500 mg Oral Daily Leana Gamer, MD   500 mg at 09/29/15 0953  . zinc sulfate capsule 220 mg  220 mg Oral Daily Leana Gamer, MD   220 mg at 09/29/15 [id number]     Discharge Medications: Please see discharge summary for a list of discharge medications.  Relevant Imaging Results:  Relevant Lab Results:   Additional Information SS #  [id number]  Dakwon Wenberg, Randall An, LCSW

## 2015-09-29 NOTE — Progress Notes (Signed)
CRITICAL VALUE ALERT  Critical value received:  6.4 Hgb  Date of notification:  09/29/2015  Time of notification:  0701  Critical value read back: Yes Kennyth Lose RN  Nurse who received alert:  Magdalen Spatz  MD notified (1st page):  Charlies Silvers   Time of first page:  0701  MD notified (2nd page):  Time of second page:  Responding MD:  Time MD responded:

## 2015-09-29 NOTE — Progress Notes (Signed)
Initial Nutrition Assessment  DOCUMENTATION CODES:   Not applicable  INTERVENTION:  -Pro-Stat 51m TID, each supplement provides 100 calories, 15g protein -Pt declined Boost/Ensure at this time -RD monitor for needs  NUTRITION DIAGNOSIS:   Increased nutrient needs related to chronic illness as evidenced by estimated needs.  GOAL:   Patient will meet greater than or equal to 90% of their needs  MONITOR:   PO intake, I & O's, Labs, Weight trends, Skin, Supplement acceptance  REASON FOR ASSESSMENT:   Consult Wound healing  ASSESSMENT:   Sandra Flowers an 82y.o. female Pt is a 82y/o female who is been mostly W/C bound for the last month at an assisted living facility. Pt's confinement was triggered by a fall in January which led to a prolonged hospitalization (2 weeks)and then to rehab for a month and then to ALF.   Presented with sacral decubitus ulcer that has increasingly gotten worse. Is now a stage 4. Surgery consulted -> underwent incision & drainage 3/17.  Pt endorses good appetite. PO intake 0-25% in chart. Was asleep and did not order breakfast this morning. During visit she was sleeping and states "she has been taking a lot of naps." Denies weight loss. No further weight hx in chart.  Nutrition-Focused physical exam completed. Findings are no fat depletion, no muscle depletion, and no edema.   Labs: BUN 43, Ca 8.6, EGFR 49 Medications: Fe, Zn, Vit C, Morphine PRN   Diet Order:  Diet regular Room service appropriate?: Yes; Fluid consistency:: Thin  Skin:  Wound (see comment) (Sacral PU)  Last BM:  3/19  Height:   Ht Readings from Last 1 Encounters:  09/26/15 _0  (1.702 m)    Weight:   Wt Readings from Last 1 Encounters:  09/26/15 160 lb 0.9 oz (72.6 kg)    Ideal Body Weight:  61.36 kg  BMI:  Body mass index is 25.06 kg/(m^2).  Estimated Nutritional Needs:   Kcal:  2100-2400  Protein:  72-90 grams  Fluid:  >/= 2.1L  EDUCATION NEEDS:    No education needs identified at this time   [name], MS, RD LDN After Hours/Weekend Pager [phone]

## 2015-09-29 NOTE — Progress Notes (Signed)
2 Days Post-Op debridement of decubitus ulcer Subjective: Mainly c/o pain with dressing changes  Objective: Vital signs in last 24 hours: Temp:  [97.6 F (36.4 C)-99.7 F (37.6 C)] 97.6 F (36.4 C) (03/19 0552) Pulse Rate:  [84-92] 90 (03/19 0552) Resp:  [16-20] 20 (03/19 0552) BP: (86-109)/(45-60) 98/45 mmHg (03/19 0552) SpO2:  [97 %-100 %] 97 % (03/19 0552)   Intake/Output from previous day: 03/18 0701 - 03/19 0700 In: 420 [P.O.:420] Out: 1125 [Urine:1125] Intake/Output this shift:     General appearance: alert and cooperative GI: normal findings: soft, non-tender  Incision: dressing clean and dry  Lab Results:   Recent Labs  09/26/15 1225 09/29/15 0506  WBC 13.1* 11.1*  HGB 7.8* 6.4*  HCT 24.3* 21.0*  PLT 302 262   BMET  Recent Labs  09/27/15 0339 09/29/15 0506  NA 141 143  K 3.7 3.6  CL 107 110  CO2 23 24  GLUCOSE 89 87  BUN 42* 43*  CREATININE 1.08* 0.99  CALCIUM 8.5* 8.6*   PT/INR No results for input(s): LABPROT, INR in the last 72 hours. ABG No results for input(s): PHART, HCO3 in the last 72 hours.  Invalid input(s): PCO2, PO2  MEDS, Scheduled . feeding supplement (PRO-STAT SUGAR FREE 64)  30 mL Oral TID  . ferrous sulfate  325 mg Oral Q breakfast  . piperacillin-tazobactam (ZOSYN)  IV  3.375 g Intravenous Q8H  . saccharomyces boulardii  250 mg Oral BID  . senna-docusate  2 tablet Oral QHS  . vancomycin  750 mg Intravenous Q24H  . vitamin C  500 mg Oral Daily  . zinc sulfate  220 mg Oral Daily    Studies/Results: No results found.  Assessment: s/p Procedure(s): DEBRIDMENT OF DECUBITUS ULCER Patient Active Problem List   Diagnosis Date Noted  . Decubitus ulcer, infected 09/26/2015  . Meningioma ([place])   . Hearing loss       Plan: Cont wound care: Dressing changes BID and PRN. Premedicate for changes Keep wound free of stool wound RN to eval for any further topical treatments Anemia: Hgb 6.4- will defer to primary team  for management of transfusion   LOS: 3 days     .Rosario Adie, [facility], [place]   09/29/2015 8:03 AM

## 2015-09-29 NOTE — Progress Notes (Addendum)
Pharmacy Antibiotic Note  Sandra Flowers is a 82 y.o. female admitted on 09/26/2015 from nursing home with large sacral decub ulcer.  Pharmacy has been consulted for Zosyn and Vancomycin dosing.  Plan:  Cont Zosyn 3.375g IV Q8H infused over 4hrs.   Increase Vancomycin to 1250mg  IV q24h. Can Vanc be stopped?   Follow up renal fxn, culture results, and clinical course.  Height: 5\' 7"  (170.2 cm) Weight: 160 lb 0.9 oz (72.6 kg) IBW/kg (Calculated) : 61.6 Ht and wt info not yet updated in [facility].  [facility] reports that most recent (09/05/15) height 5'7" and weight 160 lbs (72.7 kg)  Temp (24hrs), Avg:98.4 F (36.9 C), Min:97.6 F (36.4 C), Max:99.7 F (37.6 C)   Recent Labs Lab 09/26/15 1138 09/26/15 1225 09/27/15 0339 09/29/15 0506 09/29/15 1010  WBC  --  13.1*  --  11.1*  --   CREATININE 1.00 1.17* 1.08* 0.99  --   VANCOTROUGH  --   --   --   --  11    Estimated Creatinine Clearance: 38.2 mL/min (by C-G formula based on Cr of 0.99).    No Known Allergies  Antimicrobials this admission: 3/15 Zosyn >>  3/15 Vancomycin >>   Dose adjustments this admission: 3/19: VT = 11(drawn 2 hours early), on 750mg  q24h. Increase to 1250mg  q24h.  Microbiology results: 3/16 UCx: insignif growth 3/17 Sacral wound: Morganella - sens to cefepime, ceftazidime, ceftriaxone, Cipro, gent/tobra, Zosyn, Septra.   3/17 MRSA PCR: neg  Thank you for allowing pharmacy to be a part of this patient's care.  Romeo Rabon, PharmD, pager [phone]. 09/29/2015,11:28 AM.

## 2015-09-30 LAB — CBC
HCT: 29 % — ABNORMAL LOW (ref 36.0–46.0)
Hemoglobin: 9.4 g/dL — ABNORMAL LOW (ref 12.0–15.0)
MCH: 29.7 pg (ref 26.0–34.0)
MCHC: 32.4 g/dL (ref 30.0–36.0)
MCV: 91.8 fL (ref 78.0–100.0)
Platelets: 258 10*3/uL (ref 150–400)
RBC: 3.16 MIL/uL — AB (ref 3.87–5.11)
RDW: 15.5 % (ref 11.5–15.5)
WBC: 9.6 10*3/uL (ref 4.0–10.5)

## 2015-09-30 LAB — BASIC METABOLIC PANEL
ANION GAP: 11 (ref 5–15)
BUN: 41 mg/dL — ABNORMAL HIGH (ref 6–20)
CALCIUM: 8.6 mg/dL — AB (ref 8.9–10.3)
CO2: 21 mmol/L — AB (ref 22–32)
Chloride: 111 mmol/L (ref 101–111)
Creatinine, Ser: 0.85 mg/dL (ref 0.44–1.00)
GFR calc Af Amer: >60 mL/min (ref >60)
GFR calc non Af Amer: 59 mL/min — ABNORMAL LOW (ref >60)
GLUCOSE: 108 mg/dL — AB (ref 65–99)
Potassium: 3.5 mmol/L (ref 3.5–5.1)
Sodium: 143 mmol/L (ref 135–145)

## 2015-09-30 LAB — TYPE AND SCREEN
ABO/RH(D): O POS
Antibody Screen: NEGATIVE
Unit division: 0
Unit division: 0

## 2015-09-30 LAB — TISSUE CULTURE: Gram Stain: NONE SEEN

## 2015-09-30 LAB — ABO/RH: ABO/RH(D): O POS

## 2015-09-30 NOTE — Consult Note (Addendum)
WOC wound consult note Reason for Consult: Chronic nonhealing stage 4 pressure injury.  Recent surgical debridement of devitalized tissue.  Visible bone.  Little potential to heal currently.  Right hip and knee pain is intense.  Currently alternating KP pad between the two.  Hgb 6.4 and Albumin 2.4 NPWT (VAC) therapy may be minimally effective at this time.  Wound type:Stage 4 pressure injury, present on admission Pressure Ulcer POA: Yes Measurement:7 cm x 10 cm x 4 cm with undermining circumferentially, extending 3.2 cm.  Some denuded skin present on wound perimeter Bilateral heels with deep tissue injury, measuring 3 cm x 3 cm intact.  Will offload with Prevalon . Wound bed:75% pale pink 25% adherent gray slough over wound bed.  Drainage (amount, consistency, odor)Minimal serosanguinous drainage.  No odor.  Periwound:Some medical adhesive related skin stripping present at 4 and 7 o'clock.  Dressing procedure/placement/frequency:Cleanse sacral wound with NS and pat gently dry. Fill wound bed, including undermining with NS moist kerlix (used 3/4 roll).  Cover with ABD pad.  Change each shift.  Will monitor HGB and Albumin and assess for increased ability to heal.  NPWT may be appropriate if improved.   [place] team will follow. Hydrotherapy has been ordered by MD.   Domenic Moras RN BSN [facility] Pager [phone]

## 2015-09-30 NOTE — Progress Notes (Addendum)
Patient ID: Sandra Flowers, female   DOB: Jan 17, 1925, 82 y.o.   MRN: [id number] [facility] PROGRESS NOTE  Sandra Flowers [id number] DOB: 1924/10/19 DOA: 09/26/2015 PCP: Merlene Laughter, Sandra Flowers  Brief narrative:    82 year old female with past medical history of hypertension, wheelchair bound, sacral decubitus ulcer and per family sacral decubitus wound has become progressively worse in last 2 weeks prior to this admission. Patient was hemodynamically stable on the admission. She was seen by surgery in consultation and she underwent I&D 3/17.  Assessment/Plan:    Principal Problem: Infected Sacral Pressure Ulcer (UnstageableStage 4 with tunneling) / Infection dueto Morganella  - S/P I&D [id number] by Dr. Leighton Ruff - Tissue culture grew Morganella  - Continue zosyn, stop vanco   Active Problems: UTI / Leukocytosis - Urinalysis on the admission showed large leukocytes and too numerous to count white blood cells - No significant growth on Urine culture   Chronic kidney disease stage III  - Considering the degree of anemia patient most likely has chronic kidney disease rather than acute kidney injury however we have no previous values for comparison - Creatinine improved with hydration.  Anemia of chronic disease / acute blood loss anemia - Likely from long-term antiplatelet therapy  - Hemoglobin drop noted during this hospital stay from 7.8 down to 6.4. Likely from recent procedure.  - Current anemia panel does show low iron and low TIBC and we started low-dose ferrous sulfate supplementation - Transfused 2 units of PRBC 3/19 - Hemoglobin stable at 9.4  DVT prophylaxis - Stopped lovenox and aggrenox due to anemia - Continue SCDs bilaterally  Code Status: DNR/DNI Family Communication:  plan of care discussed with the patient Disposition Plan: d/c likely by 3/21  IV access:  Peripheral IV  Procedures and diagnostic studies:    Sacral decubitus ulcer debridement [id number]  by Dr. Leighton Ruff  Medical Consultants:  Surgery  Other Consultants:  [facility]  IAnti-Infectives:   Zosyn 09/26/2015 --> Vanco 09/26/2015 --> 09/30/2015   Sandra Lenz, Sandra Flowers  [facility] Pager [phone]  Time spent in minutes: 15 minutes  If 7PM-7AM, please contact night-coverage [email or web address] Password [facility] 09/30/2015, 9:32 AM   LOS: 4 days    HPI/Subjective: No acute overnight events. No reports of bleeding.   Objective: Filed Vitals:   09/29/15 2100 09/29/15 2119 09/30/15 0048 09/30/15 0508  BP: 94/46 118/69 104/54 104/54  Pulse: 88 95 93 88  Temp: 98.2 F (36.8 C) 98.3 F (36.8 C) 99 F (37.2 C) 98.8 F (37.1 C)  TempSrc: Axillary Oral Oral Axillary  Resp: 20 20 16 16   Height:      Weight:      SpO2: 100% 100% 99% 99%    Intake/Output Summary (Last 24 hours) at 09/30/15 0932 Last data filed at 09/30/15 0511  Gross per 24 hour  Intake   1450 ml  Output    825 ml  Net    625 ml    Exam:   General:  Pt is not  In acute distress   Cardiovascular: Rate controlled, appreciate S1, S2   Respiratory: no wheezing, no rhonchi   Abdomen: (+) BS, non tender   Extremities: No swelling, palpable pulses   Neuro: Nonfocal   Data Reviewed: Basic Metabolic Panel:  Recent Labs Lab 09/26/15 1138 09/26/15 1225 09/27/15 0339 09/29/15 0506 09/30/15 0345  NA 139 143 141 143 143  K 3.6 3.6 3.7 3.6 3.5  CL 102 106 107 110 111  CO2  --  23 23 24  21*  GLUCOSE 124* 125* 89 87 108*  BUN 39* 45* 42* 43* 41*  CREATININE 1.00 1.17* 1.08* 0.99 0.85  CALCIUM  --  8.9 8.5* 8.6* 8.6*   Liver Function Tests:  Recent Labs Lab 09/26/15 1225  AST 37  ALT 19  ALKPHOS 97  BILITOT 0.5  PROT 6.2*  ALBUMIN 2.4*   No results for input(s): LIPASE, AMYLASE in the last 168 hours. No results for input(s): AMMONIA in the last 168 hours. CBC:  Recent Labs Lab 09/26/15 1138 09/26/15 1225 09/29/15 0506 09/30/15 0345  WBC  --  13.1* 11.1* 9.6  NEUTROABS  --   11.5*  --   --   HGB 7.8* 7.8* 6.4* 9.4*  HCT 23.0* 24.3* 21.0* 29.0*  MCV  --  94.9 101.4* 91.8  PLT  --  302 262 258   Cardiac Enzymes: No results for input(s): CKTOTAL, CKMB, CKMBINDEX, TROPONINI in the last 168 hours. BNP: Invalid input(s): POCBNP CBG: No results for input(s): GLUCAP in the last 168 hours.  Recent Results (from the past 240 hour(s))  Urine culture     Status: None   Collection Time: 09/26/15  2:09 PM  Result Value Ref Range Status   Specimen Description URINE, CATHETERIZED  Final   Special Requests NONE  Final   Culture   Final    2,000 COLONIES/mL INSIGNIFICANT GROWTH Performed at [facility]    Report Status 09/27/2015 FINAL  Final  Tissue culture     Status: None   Collection Time: 09/27/15 10:50 AM  Result Value Ref Range Status   Specimen Description TISSUE DECUBITIS ULCER  Final   Special Requests NONE  Final   Gram Stain   Final    NO WBC SEEN ABUNDANT GRAM POSITIVE COCCI IN PAIRS MODERATE GRAM NEGATIVE RODS Performed at [facility]    Culture   Final    MODERATE MORGANELLA MORGANII Note: IMRPPM Performed at [facility]    Report Status 09/30/2015 FINAL  Final   Organism ID, Bacteria MORGANELLA MORGANII  Final      Susceptibility   Morganella morganii - MIC*    AMPICILLIN >=32 RESISTANT Resistant     AMPICILLIN/SULBACTAM 16 INTERMEDIATE Intermediate     CEFAZOLIN >=64 RESISTANT Resistant     CEFEPIME <=1 SENSITIVE Sensitive     CEFTAZIDIME <=1 SENSITIVE Sensitive     CEFTRIAXONE <=1 SENSITIVE Sensitive     CIPROFLOXACIN <=0.25 SENSITIVE Sensitive     GENTAMICIN <=1 SENSITIVE Sensitive     IMIPENEM 8 RESISTANT Resistant     PIP/TAZO <=4 SENSITIVE Sensitive     TOBRAMYCIN <=1 SENSITIVE Sensitive     TRIMETH/SULFA Value in next row Sensitive      <=20 SENSITIVE(NOTE)    * MODERATE MORGANELLA MORGANII  MRSA PCR Screening     Status: None   Collection Time: 09/27/15  4:31 PM  Result Value Ref Range Status    MRSA by PCR NEGATIVE NEGATIVE Final    Comment:        The GeneXpert MRSA Assay (FDA approved for NASAL specimens only), is one component of a comprehensive MRSA colonization surveillance program. It is not intended to diagnose MRSA infection nor to guide or monitor treatment for MRSA infections.      Scheduled Meds: . sodium chloride   Intravenous Once  . feeding supplement (PRO-STAT SUGAR FREE 64)  30 mL Oral TID  . ferrous sulfate  325 mg Oral Q breakfast  .  piperacillin-tazobactam (ZOSYN)  IV  3.375 g Intravenous Q8H  . saccharomyces boulardii  250 mg Oral BID  . senna-docusate  2 tablet Oral QHS  . vancomycin  1,250 mg Intravenous Q24H  . vitamin C  500 mg Oral Daily  . zinc sulfate  220 mg Oral Daily   Continuous Infusions: . sodium chloride 75 mL/hr at 09/28/15 1751

## 2015-09-30 NOTE — Progress Notes (Signed)
Patient ID: Sandra Flowers, female   DOB: 14-May-1925, 82 y.o.   MRN: [id number]  [facility]  Subjective: Patient awake and responsive.  Complains of pain with dressing change.  Objective: Vital signs in last 24 hours: Temp:  [97.4 F (36.3 C)-99 F (37.2 C)] 98.8 F (37.1 C) (03/20 0508) Pulse Rate:  [68-95] 88 (03/20 0508) Resp:  [16-20] 16 (03/20 0508) BP: (94-118)/(46-69) 104/54 mmHg (03/20 0508) SpO2:  [95 %-100 %] 99 % (03/20 0508) Last BM Date: 09/29/15  Intake/Output from previous day: 03/19 0701 - 03/20 0700 In: 1510 [P.O.:300; I.V.:500; Blood:710] Out: 925 [Urine:925] Intake/Output this shift:    Physical Exam: HEENT - sclerae clear, mucous membranes moist Sacrum - large irregular wound with some early granulation, some necrotic thin tissue over bone in midline, no bleeding; dressing changed with NS moistened Kerlix gauze wet to dry.  Lab Results:   Recent Labs  09/29/15 0506 09/30/15 0345  WBC 11.1* 9.6  HGB 6.4* 9.4*  HCT 21.0* 29.0*  PLT 262 258   BMET  Recent Labs  09/29/15 0506 09/30/15 0345  NA 143 143  K 3.6 3.5  CL 110 111  CO2 24 21*  GLUCOSE 87 108*  BUN 43* 41*  CREATININE 0.99 0.85  CALCIUM 8.6* 8.6*   PT/INR No results for input(s): LABPROT, INR in the last 72 hours. Comprehensive Metabolic Panel:    Component Value Date/Time   NA 143 09/30/2015 0345   NA 143 09/29/2015 0506   K 3.5 09/30/2015 0345   K 3.6 09/29/2015 0506   CL 111 09/30/2015 0345   CL 110 09/29/2015 0506   CO2 21* 09/30/2015 0345   CO2 24 09/29/2015 0506   BUN 41* 09/30/2015 0345   BUN 43* 09/29/2015 0506   CREATININE 0.85 09/30/2015 0345   CREATININE 0.99 09/29/2015 0506   GLUCOSE 108* 09/30/2015 0345   GLUCOSE 87 09/29/2015 0506   CALCIUM 8.6* 09/30/2015 0345   CALCIUM 8.6* 09/29/2015 0506   AST 37 09/26/2015 1225   ALT 19 09/26/2015 1225   ALKPHOS 97 09/26/2015 1225   BILITOT 0.5 09/26/2015 1225   PROT 6.2*  09/26/2015 1225   ALBUMIN 2.4* 09/26/2015 1225    Studies/Results: No results found.  Assessment & Plans: Stage IV sacral decubitus ulcer  Dressing changed by MD - bid dressing changes currently  Will ask [place] nurse to evaluate  Will consult PT for hydrotherapy  Earnstine Regal, MD, [facility] Office: [place] 09/30/2015

## 2015-09-30 NOTE — Progress Notes (Signed)
Pharmacy Antibiotic Note  Sandra Flowers is a 82 y.o. female admitted on 09/26/2015 from nursing home with large sacral decub ulcer.  Pharmacy has been consulted for Zosyn and Vancomycin dosing.  Plan:  Cont Zosyn 3.375g IV Q8H infused over 4hrs.   Per communication with Dr. Charlies Silvers, will dc vancomycin based on cultures/sensitivities  Continue to follow up renal fxn  Height: 5\' 7"  (170.2 cm) Weight: 160 lb 0.9 oz (72.6 kg) IBW/kg (Calculated) : 61.6  Temp (24hrs), Avg:98.4 F (36.9 C), Min:97.4 F (36.3 C), Max:99 F (37.2 C)   Recent Labs Lab 09/26/15 1138 09/26/15 1225 09/27/15 0339 09/29/15 0506 09/29/15 1010 09/30/15 0345  WBC  --  13.1*  --  11.1*  --  9.6  CREATININE 1.00 1.17* 1.08* 0.99  --  0.85  VANCOTROUGH  --   --   --   --  11  --     Estimated Creatinine Clearance: 44.5 mL/min (by C-G formula based on Cr of 0.85).    No Known Allergies  Antimicrobials this admission: 3/15 Zosyn >>  3/15 Vancomycin >> 3/20  Dose adjustments this admission: 3/19: VT = 11(drawn 2 hours early), on 750mg  q24h. Increase to 1250mg  q24h. 3/20: Vanc dc'd  Microbiology results: 3/16 UCx: insignif growth 3/17 Sacral wound: Morganella - sens to cefepime, ceftazidime, ceftriaxone, Cipro, gent/tobra, Zosyn, Septra.   3/17 MRSA PCR: neg  Thank you for allowing pharmacy to be a part of this patient's care.  Peggyann Juba, PharmD, BCPS Pager: [phone] 09/30/2015,10:07 AM.

## 2015-10-01 LAB — CBC
HEMATOCRIT: 30.5 % — AB (ref 36.0–46.0)
HEMOGLOBIN: 9.6 g/dL — AB (ref 12.0–15.0)
MCH: 30.5 pg (ref 26.0–34.0)
MCHC: 31.5 g/dL (ref 30.0–36.0)
MCV: 96.8 fL (ref 78.0–100.0)
Platelets: 287 10*3/uL (ref 150–400)
RBC: 3.15 MIL/uL — AB (ref 3.87–5.11)
RDW: 15.8 % — ABNORMAL HIGH (ref 11.5–15.5)
WBC: 9.5 10*3/uL (ref 4.0–10.5)

## 2015-10-01 MED ORDER — OXYCODONE-ACETAMINOPHEN 5-325 MG PO TABS: 1.0000 | ORAL_TABLET | ORAL | Status: DC | PRN

## 2015-10-01 MED ORDER — FERROUS SULFATE 325 (65 FE) MG PO TABS: 325.0000 mg | ORAL_TABLET | Freq: Every day | ORAL | Status: DC

## 2015-10-01 MED ORDER — PRO-STAT SUGAR FREE PO LIQD: 30.0000 mL | Freq: Three times a day (TID) | ORAL | Status: DC

## 2015-10-01 MED ORDER — CIPROFLOXACIN HCL 500 MG PO TABS: 500.0000 mg | ORAL_TABLET | Freq: Two times a day (BID) | ORAL | Status: DC

## 2015-10-01 MED ORDER — DOXYCYCLINE HYCLATE 100 MG PO TABS: 100.0000 mg | ORAL_TABLET | Freq: Two times a day (BID) | ORAL | Status: DC

## 2015-10-01 MED ORDER — AMOXICILLIN-POT CLAVULANATE 875-125 MG PO TABS: 1.0000 | ORAL_TABLET | Freq: Two times a day (BID) | ORAL | Status: DC

## 2015-10-01 MED ORDER — CIPROFLOXACIN HCL 500 MG PO TABS
500.0000 mg | ORAL_TABLET | Freq: Two times a day (BID) | ORAL | Status: DC
  Administered 2015-10-01 – 2015-10-03 (×5): 500 mg via ORAL
  Filled 2015-10-01 (×5): qty 1

## 2015-10-01 NOTE — Clinical Social Work Note (Signed)
Clinical Social Work Assessment  Patient Details  Name: Sandra Flowers MRN: [id number] Date of Birth: [birth year]  Date of referral:  10/01/15               Reason for consult:  Discharge Planning                Permission sought to share information with:  Family Supports Permission granted to share information::     Name::     Sandra Flowers  Agency::     Relationship::  daughter  Contact Information:  [phone]  Housing/Transportation Living arrangements for the past 2 months:  [place] of Information:  Adult Children Patient Interpreter Needed:  None Criminal Activity/Legal Involvement Pertinent to Current Situation/Hospitalization:  No - Comment as needed Significant Relationships:  Adult Children Lives with:  Facility Resident Do you feel safe going back to the place where you live?  No Need for family participation in patient care:  Yes (Comment)  Care giving concerns:  Pt admitted from [place]. Pt has infected decubitus ulcer that ALF cannot manage any longer. SNF recommended upon discharge.   Social Worker assessment / plan:    CSW spoke to pt daughter, Denman George via telephone re: disposition planning. CSW discussed with pt daughter need for SNF given pt wound care. Pt daughter agreeable. Pt daughter states that pt has been to [facility] in the past and pt daughter would prefer for pt to go back to [facility].   CSW completed FL2 and initiated SNF search to [facility]. CSW spoke with [facility] who confirmed that facility can offer pt a bed.   CSW notified pt daughter of availability at [facility].   Per MD, anticipating d/c tomorrow.   CSW to continue to follow.   Employment status:  Retired Nurse, adult PT Recommendations:  [place] / Referral to community resources:  [place]  Patient/Family's Response to care:  Pt oriented to person only per chart. Pt daughter agreeable to plan for SNF. Pt daughter glad that pt can go to [facility].   Patient/Family's Understanding of and Emotional Response to Diagnosis, Current Treatment, and Prognosis:  Pt daughter displayed understanding surrounding pt diagnosis and treatment plan.   Emotional Assessment Appearance:  Appears stated age Attitude/Demeanor/Rapport:  Unable to Assess Affect (typically observed):  Unable to Assess Orientation:  Oriented to Self Alcohol / Substance use:  Not Applicable Psych involvement (Current and /or in the community):  No (Comment)  Discharge Needs  Concerns to be addressed:  Discharge Planning Concerns Readmission within the last 30 days:  No Current discharge risk:  Physical Impairment Barriers to Discharge:  Continued Medical Work up   Ladell Pier, [place] 10/01/2015, 5:31 PM  [phone]

## 2015-10-01 NOTE — Care Management Important Message (Signed)
Important Message  Patient Details  Name: Sandra Flowers MRN: [id number] Date of Birth: 1925-03-26   Medicare Important Message Given:  Yes    Camillo Flaming 10/01/2015, [place] Message  Patient Details  Name: Sandra Flowers MRN: [id number] Date of Birth: 1925/04/29   Medicare Important Message Given:  Yes    Camillo Flaming 10/01/2015, 11:14 AM

## 2015-10-01 NOTE — NC FL2 (Signed)
[place] LEVEL OF CARE SCREENING TOOL     IDENTIFICATION  Patient Name: Sandra Flowers Birthdate: January 03, 1925 Sex: female Admission Date (Current Location): 09/26/2015  [facility] and [place] Number:  Herbalist and Address:  [facility],  [place] [address], [place]      Provider Number: [id number]  Attending Physician Name and Address:  Robbie Lis, MD  Relative Name and Phone Number:       Current Level of Care: Hospital Recommended Level of Care: [place] Prior Approval Number:    Date Approved/Denied:   PASRR Number: [id number]  Discharge Plan: SNF    Current Diagnoses: Patient Active Problem List   Diagnosis Date Noted  . Decubitus ulcer, infected 09/26/2015  . Meningioma ([place])   . Hearing loss     Orientation RESPIRATION BLADDER Height & Weight     Self  Normal Indwelling catheter Weight: 160 lb 0.9 oz (72.6 kg) Height:  5\' 7"  (170.2 cm)  BEHAVIORAL SYMPTOMS/MOOD NEUROLOGICAL BOWEL NUTRITION STATUS   (no behaviors)  (NONE) Incontinent Diet  AMBULATORY STATUS COMMUNICATION OF NEEDS Skin   Extensive Assist Verbally Other (Comment) (Chronic nonhealing stage 4 pressure injury. Recent surgical debridement of devitalized tissue. Visible bone. )                       Personal Care Assistance Level of Assistance  Bathing, Feeding, Dressing Bathing Assistance: Maximum assistance Feeding assistance: Limited assistance Dressing Assistance: Maximum assistance     Functional Limitations Info  Sight, Hearing, Speech Sight Info: Adequate Hearing Info: Impaired Speech Info: Adequate    SPECIAL CARE FACTORS FREQUENCY  PT (By licensed PT)     PT Frequency: 5 x a week              Contractures Contractures Info: Not present    Additional Factors Info  Code Status, Allergies Code Status Info: FULL code status Allergies Info: No Known Allergies           Current Medications  (10/01/2015):  This is the current hospital active medication list Current Facility-Administered Medications  Medication Dose Route Frequency Provider Last Rate Last Dose  . 0.9 %  sodium chloride infusion   Intravenous Continuous Robbie Lis, MD 10 mL/hr at 09/30/15 1004    . 0.9 %  sodium chloride infusion   Intravenous Once Robbie Lis, MD      . ciprofloxacin (CIPRO) tablet 500 mg  500 mg Oral BID Robbie Lis, MD   500 mg at 10/01/15 1246  . feeding supplement (PRO-STAT SUGAR FREE 64) liquid 30 mL  30 mL Oral TID Leana Gamer, MD   30 mL at 10/01/15 1026  . ferrous sulfate tablet 325 mg  325 mg Oral Q breakfast Robbie Lis, MD   325 mg at 10/01/15 1026  . lip balm (CARMEX) ointment   Topical PRN Leana Gamer, MD      . morphine 2 MG/ML injection 1 mg  1 mg Intravenous Q2H PRN Robbie Lis, MD   1 mg at 09/30/15 1239  . morphine 2 MG/ML injection 2-4 mg  2-4 mg Intravenous [id number] PRN Leighton Ruff, MD   4 mg at 10/01/15 1240  . oxyCODONE-acetaminophen (PERCOCET/ROXICET) 5-325 MG per tablet 1 tablet  1 tablet Oral Q8H PRN Leana Gamer, MD   1 tablet at 09/29/15 2138  . polyvinyl alcohol (LIQUIFILM TEARS) 1.4 % ophthalmic solution 1 drop  1 drop Both Eyes PRN Leana Gamer, MD      . saccharomyces boulardii (FLORASTOR) capsule 250 mg  250 mg Oral BID Leana Gamer, MD   250 mg at 10/01/15 1026  . senna-docusate (Senokot-S) tablet 2 tablet  2 tablet Oral QHS Leana Gamer, MD   2 tablet at 09/28/15 1957  . vitamin C (ASCORBIC ACID) tablet 500 mg  500 mg Oral Daily Leana Gamer, MD   500 mg at 10/01/15 1026  . zinc sulfate capsule 220 mg  220 mg Oral Daily Leana Gamer, MD   220 mg at 10/01/15 1026     Discharge Medications: Please see discharge summary for a list of discharge medications.  Relevant Imaging Results:  Relevant Lab Results:   Additional Information SS # [id number] Pt needs air mattress for wound. Chronic nonhealing  stage 4 pressure injury. Recent surgical debridement of devitalized tissue. Visible bone. Little potential to heal currently. Right hip and knee pain is intense. Currently alternating KP pad between the two. Treatment: cleanse sacral wound with NS and pat gently dry. Fill wound bed, including undermining with NS moist kerlix (used 3/4 roll). Cover with ABD pad. Change each shift. Will monitor HGB and Albumin and assess for increased ability to heal. NPWT may be appropriate if improved.  Sandra Flowers A, LCSW

## 2015-10-01 NOTE — Progress Notes (Signed)
PT Cancellation Note  Patient Details Name: Sandra Flowers MRN: [id number] DOB: [birth year]   Cancelled Treatment:    Reason Eval/Treat Not Completed: Other (comment);PT screened, no needs identified, will sign off. Chart reviewed. Spoke to RN who reports that the patient is receiving IV pain meds frequently due to pain. WOCRN also reports poor healing potential. Spoke with MSW who reports plan is for patient to DC to SNF. At this time, PT will not  initiate Hydrotherapy while in hospital due to the fact that the patient  Is Being Discharged soon to a  facility without hydrotherapy being indicated. Also, patient should be maintained on  An air mattress for pressure relief due to large and deep decubitus so a PT evaluation for mobility is not indicated. Please call this write for questions.   [place] PT [id number]   Claretha Cooper 10/01/2015, 1:30 PM

## 2015-10-01 NOTE — Discharge Summary (Addendum)
Physician Discharge Summary  Willard Nordell [id number] DOB: 1925-01-18 DOA: 09/26/2015  PCP: Merlene Laughter, MD  Admit date: 09/26/2015 Discharge date: 10/02/2015  Recommendations for Outpatient Follow-up:  1. Cipro  for 7 days on discharge for Morganella on tissue culture  2. Continue aggrenox on discharge but monitor CBC per SNF protocol. Hgb 9.6 on discharge. She did get 2 U PRBC during this hospital stay and we did start ferrous sulfate supplement once ad ay.   Discharge Diagnoses:  Active Problems:   Decubitus ulcer, infected   Discharge Condition: stable   Diet recommendation: as tolerated   History of present illness:  82 year old female with past medical history of hypertension, wheelchair bound, sacral decubitus ulcer and per family sacral decubitus wound has become progressively worse in last 2 weeks prior to this admission. Patient was hemodynamically stable on the admission. She was seen by surgery in consultation and she underwent I&D 3/17.  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 Leighton Ruff - Tissue culture grew Morganella  - Stopped vanco 3/20, stop zosyn 3/21 - Cipro on discharge for 7 days   Active Problems: UTI / Leukocytosis - Urinalysis on the admission showed large leukocytes and too numerous to count white blood cells - No significant growth on urine culture   Chronic kidney disease stage III  - Considering the degree of anemia patient most likely has chronic kidney disease rather than acute kidney injury however we have no previous values for comparison - Creatinine improved with hydration.  Anemia of chronic disease / acute blood loss anemia - Likely from long-term antiplatelet therapy  - Hemoglobin drop noted during this hospital stay from 7.8 down to 6.4. Likely from recent procedure.  - Current  anemia panel does show low iron and low TIBC and we started low-dose ferrous sulfate supplementation - Transfused 2 units of PRBC 3/19 - Hemoglobin stable at 9.6  DVT prophylaxis - Stopped lovenox and aggrenox due to anemia - Had SCD's bilaterally but on d/c okay for aggrenox since hgb stable, 9.6  Code Status: DNR/DNI Family Communication: plan of care discussed with the patient; spoke with her daughter over the phone    IV access:  Peripheral IV  Procedures and diagnostic studies:   Sacral decubitus ulcer debridement [id number] by Dr. Leighton Ruff  Medical Consultants:  Surgery  Other Consultants:  [facility]  IAnti-Infectives:   Zosyn 09/26/2015 --> 10/01/2015 Vanco 09/26/2015 --> 09/30/2015  Signed:  Leisa Lenz, MD  [facility] 10/01/2015, 12:13 PM  Pager #: [phone]  Time spent in minutes: more than 30 minutes   Discharge Exam: Filed Vitals:   09/30/15 2246 10/01/15 0544  BP: 115/54 112/54  Pulse: 106 101  Temp: 97.5 F (36.4 C) 99 F (37.2 C)  Resp: 16 16   Filed Vitals:   09/30/15 0508 09/30/15 1356 09/30/15 2246 10/01/15 0544  BP: 104/54 110/59 115/54 112/54  Pulse: 88 91 106 101  Temp: 98.8 F (37.1 C) 99.5 F (37.5 C) 97.5 F (36.4 C) 99 F (37.2 C)  TempSrc: Axillary Oral Oral Oral  Resp: 16 14 16 16   Height:      Weight:      SpO2: 99% 95% 98% 95%    General: Pt is not in acute distress Cardiovascular: Regular rate and rhythm, S1/S2 +, no murmurs Respiratory: Clear to auscultation bilaterally, no wheezing, no crackles,  no rhonchi Abdominal: Soft, non tender, non distended, bowel sounds +, no guarding Extremities: no edema, no cyanosis, pulses palpable bilaterally DP and PT Neuro: Grossly nonfocal  Discharge Instructions  Discharge Instructions    Call MD for:  difficulty breathing, headache or visual disturbances    Complete by:  As directed      Call MD for:  persistant dizziness or light-headedness     Complete by:  As directed      Call MD for:  persistant nausea and vomiting    Complete by:  As directed      Call MD for:  severe uncontrolled pain    Complete by:  As directed      Diet - low sodium heart healthy    Complete by:  As directed      Discharge instructions    Complete by:  As directed   May continue aggrenox on discharge but continue to monitor CBC episodically to make sure it is stable. Hgb is 9.6 on discharge.     Increase activity slowly    Complete by:  As directed             Medication List    STOP taking these medications        amoxicillin-clavulanate 875-125 MG tablet  Commonly known as:  AUGMENTIN     BENGAY GREASELESS EX     collagenase ointment  Commonly known as:  SANTYL     losartan-hydrochlorothiazide 100-25 MG tablet  Commonly known as:  HYZAAR     metroNIDAZOLE 500 MG tablet  Commonly known as:  FLAGYL      TAKE these medications        acetaminophen 325 MG tablet  Commonly known as:  TYLENOL  Take 650 mg by mouth every 4 (four) hours as needed (pain.).     ciprofloxacin 500 MG tablet  Commonly known as:  CIPRO  Take 1 tablet (500 mg total) by mouth 2 (two) times daily.     dipyridamole-aspirin 200-25 MG 12hr capsule  Commonly known as:  AGGRENOX  Take 1 capsule by mouth 2 (two) times daily.     feeding supplement (PRO-STAT SUGAR FREE 64) Liqd  Take 30 mLs by mouth 3 (three) times daily.     ferrous sulfate 325 (65 FE) MG tablet  Take 1 tablet (325 mg total) by mouth daily with breakfast.     oxyCODONE-acetaminophen 5-325 MG tablet  Commonly known as:  PERCOCET/ROXICET  Take 1 tablet by mouth every 4 (four) hours as needed for moderate pain (pain related to muscle weakness, generalized.).     Polyethyl Glycol-Propyl Glycol 0.4-0.3 % Gel ophthalmic gel  Commonly known as:  SYSTANE  Place 1 application into both eyes daily as needed (drye eye syndrome.).     protein supplement Powd  Take 1 scoop by mouth 3 (three) times  daily with meals.     saccharomyces boulardii 250 MG capsule  Commonly known as:  FLORASTOR  Take 250 mg by mouth 2 (two) times daily.     senna-docusate 8.6-50 MG tablet  Commonly known as:  Senokot-S  Take 2 tablets by mouth at bedtime.     vitamin C 500 MG tablet  Commonly known as:  ASCORBIC ACID  Take 500 mg by mouth daily.     zinc sulfate 220 MG capsule  Take 220 mg by mouth daily.             The results of significant diagnostics from this  hospitalization (including imaging, microbiology, ancillary and laboratory) are listed below for reference.    Significant Diagnostic Studies: No results found.  Microbiology: Recent Results (from the past 240 hour(s))  Urine culture     Status: None   Collection Time: 09/26/15  2:09 PM  Result Value Ref Range Status   Specimen Description URINE, CATHETERIZED  Final   Special Requests NONE  Final   Culture   Final    2,000 COLONIES/mL INSIGNIFICANT GROWTH Performed at [facility]    Report Status 09/27/2015 FINAL  Final  Anaerobic culture     Status: None (Preliminary result)   Collection Time: 09/27/15 10:48 AM  Result Value Ref Range Status   Specimen Description WOUND DECUBITIS ULCER  Final   Special Requests NONE  Final   Gram Stain PENDING  Incomplete   Culture   Final    NO ANAEROBES ISOLATED; CULTURE IN PROGRESS FOR 5 DAYS Performed at [facility]    Report Status PENDING  Incomplete  Tissue culture     Status: None   Collection Time: 09/27/15 10:50 AM  Result Value Ref Range Status   Specimen Description TISSUE DECUBITIS ULCER  Final   Special Requests NONE  Final   Gram Stain   Final    NO WBC SEEN ABUNDANT GRAM POSITIVE COCCI IN PAIRS MODERATE GRAM NEGATIVE RODS Performed at [facility]    Culture   Final    MODERATE MORGANELLA MORGANII Note: IMRPPM Performed at [facility]    Report Status 09/30/2015 FINAL  Final   Organism ID, Bacteria MORGANELLA MORGANII   Final      Susceptibility   Morganella morganii - MIC*    AMPICILLIN >=32 RESISTANT Resistant     AMPICILLIN/SULBACTAM 16 INTERMEDIATE Intermediate     CEFAZOLIN >=64 RESISTANT Resistant     CEFEPIME <=1 SENSITIVE Sensitive     CEFTAZIDIME <=1 SENSITIVE Sensitive     CEFTRIAXONE <=1 SENSITIVE Sensitive     CIPROFLOXACIN <=0.25 SENSITIVE Sensitive     GENTAMICIN <=1 SENSITIVE Sensitive     IMIPENEM 8 RESISTANT Resistant     PIP/TAZO <=4 SENSITIVE Sensitive     TOBRAMYCIN <=1 SENSITIVE Sensitive     TRIMETH/SULFA Value in next row Sensitive      <=20 SENSITIVE(NOTE)    * MODERATE MORGANELLA MORGANII  MRSA PCR Screening     Status: None   Collection Time: 09/27/15  4:31 PM  Result Value Ref Range Status   MRSA by PCR NEGATIVE NEGATIVE Final    Comment:        The GeneXpert MRSA Assay (FDA approved for NASAL specimens only), is one component of a comprehensive MRSA colonization surveillance program. It is not intended to diagnose MRSA infection nor to guide or monitor treatment for MRSA infections.      Labs: Basic Metabolic Panel:  Recent Labs Lab 09/26/15 1138 09/26/15 1225 09/27/15 0339 09/29/15 0506 09/30/15 0345  NA 139 143 141 143 143  K 3.6 3.6 3.7 3.6 3.5  CL 102 106 107 110 111  CO2  --  23 23 24  21*  GLUCOSE 124* 125* 89 87 108*  BUN 39* 45* 42* 43* 41*  CREATININE 1.00 1.17* 1.08* 0.99 0.85  CALCIUM  --  8.9 8.5* 8.6* 8.6*   Liver Function Tests:  Recent Labs Lab 09/26/15 1225  AST 37  ALT 19  ALKPHOS 97  BILITOT 0.5  PROT 6.2*  ALBUMIN 2.4*   No results for input(s): LIPASE,  AMYLASE in the last 168 hours. No results for input(s): AMMONIA in the last 168 hours. CBC:  Recent Labs Lab 09/26/15 1138 09/26/15 1225 09/29/15 0506 09/30/15 0345 10/01/15 0856  WBC  --  13.1* 11.1* 9.6 9.5  NEUTROABS  --  11.5*  --   --   --   HGB 7.8* 7.8* 6.4* 9.4* 9.6*  HCT 23.0* 24.3* 21.0* 29.0* 30.5*  MCV  --  94.9 101.4* 91.8 96.8  PLT  --  302 262  258 287   Cardiac Enzymes: No results for input(s): CKTOTAL, CKMB, CKMBINDEX, TROPONINI in the last 168 hours. BNP: BNP (last 3 results) No results for input(s): BNP in the last 8760 hours.  ProBNP (last 3 results) No results for input(s): PROBNP in the last 8760 hours.  CBG: No results for input(s): GLUCAP in the last 168 hours.

## 2015-10-01 NOTE — Discharge Instructions (Signed)
Skin Ulcer  A skin ulcer is an open sore that can be shallow or deep. Skin ulcers sometimes become infected and are difficult to treat. It may be 1 month or longer before real healing progress is made.  CAUSES   · Injury.  · Problems with the veins or arteries.  · Diabetes.  · Insect bites.  · Bedsores.  · Inflammatory conditions.  SYMPTOMS   · Pain, redness, swelling, and tenderness around the ulcer.  · Fever.  · Bleeding from the ulcer.  · Yellow or clear fluid coming from the ulcer.  DIAGNOSIS   There are many types of skin ulcers. Any open sores will be examined. Certain tests will be done to determine the kind of ulcer you have. The right treatment depends on the type of ulcer you have.  TREATMENT   Treatment is a long-term challenge. It may include:  · Wearing an elastic wrap, compression stockings, or gel cast over the ulcer area.  · Taking antibiotic medicines or putting antibiotic creams on the affected area if there is an infection.  HOME CARE INSTRUCTIONS  · Put on your bandages (dressings), wraps, or casts over the ulcer as directed by your caregiver.  · Change all dressings as directed by your caregiver.  · Take all medicines as directed by your caregiver.  · Keep the affected area clean and dry.  · Avoid injuries to the affected area.  · Eat a well-balanced, healthy diet that includes plenty of fruit and vegetables.  · If you smoke, consider quitting or decreasing the amount of cigarettes you smoke.  · Once the ulcer heals, get regular exercise as directed by your caregiver.  · Work with your caregiver to make sure your blood pressure, cholesterol, and diabetes are well-controlled.  · Keep your skin moisturized. Dry skin can crack and lead to skin ulcers.  SEEK IMMEDIATE MEDICAL CARE IF:   · Your pain gets worse.  · You have swelling, redness, or fluids around the ulcer.  · You have chills.  · You have a fever.  MAKE SURE YOU:   · Understand these instructions.  · Will watch your condition.  · Will get  help right away if you are not doing well or get worse.     This information is not intended to replace advice given to you by your health care provider. Make sure you discuss any questions you have with your health care provider.     Document Released: 08/06/2004 Document Revised: 09/21/2011 Document Reviewed: 02/13/2011  Elsevier Interactive Patient Education ©2016 [employer] Inc[employer]

## 2015-10-01 NOTE — Clinical Social Work Placement (Signed)
   CLINICAL SOCIAL WORK PLACEMENT  NOTE  Date:  10/01/2015  Patient Details  Name: Zarian Facey MRN: [id number] Date of Birth: [birth year]  Clinical Social Work is seeking post-discharge placement for this patient at the [place] level of care (*CSW will initial, date and re-position this form in  chart as items are completed):  Yes   Patient/family provided with [place] Work Department's list of facilities offering this level of care within the geographic area requested by the patient (or if unable, by the patient's family).  Yes   Patient/family informed of their freedom to choose among providers that offer the needed level of care, that participate in Medicare, Medicaid or managed care program needed by the patient, have an available bed and are willing to accept the patient.  Yes   Patient/family informed of [place]'s ownership interest in [facility] and [facility], as well as of the fact that they are under no obligation to receive care at these facilities.  PASRR submitted to EDS on       PASRR number received on       Existing PASRR number confirmed on 10/01/15     FL2 transmitted to all facilities in geographic area requested by pt/family on 10/01/15     FL2 transmitted to all facilities within larger geographic area on       Patient informed that his/her managed care company has contracts with or will negotiate with certain facilities, including the following:        Yes   Patient/family informed of bed offers received.  Patient chooses bed at [facility]     Physician recommends and patient chooses bed at      Patient to be transferred to   on  .  Patient to be transferred to facility by       Patient family notified on   of transfer.  Name of family member notified:        PHYSICIAN Please sign FL2     Additional Comment:    _______________________________________________ Ladell Pier,  LCSW 10/01/2015, 5:42 PM

## 2015-10-02 DIAGNOSIS — N179 Acute kidney failure, unspecified: Secondary | ICD-10-CM

## 2015-10-02 DIAGNOSIS — D638 Anemia in other chronic diseases classified elsewhere: Secondary | ICD-10-CM

## 2015-10-02 DIAGNOSIS — R627 Adult failure to thrive: Secondary | ICD-10-CM

## 2015-10-02 DIAGNOSIS — L89154 Pressure ulcer of sacral region, stage 4: Principal | ICD-10-CM

## 2015-10-02 MED ORDER — OXYCODONE-ACETAMINOPHEN 5-325 MG PO TABS
1.0000 | ORAL_TABLET | Freq: Four times a day (QID) | ORAL | Status: DC | PRN
  Administered 2015-10-02 – 2015-10-03 (×3): 2 via ORAL
  Filled 2015-10-02 (×3): qty 2

## 2015-10-02 NOTE — Care Management Note (Signed)
Case Management Note  Patient Details  Name: Sandra Flowers MRN: [id number] Date of Birth: Jul 05, 1925  Subjective/Objective:        82 yo admitted with Infected Decubitus Ulcer            Action/Plan: Pt from ALF and plans to DC to SNF  Expected Discharge Date:   (unknown)               Expected Discharge Plan:  Skilled Nursing Facility  In-House Referral:  Clinical Social Work  Discharge planning Services  CM Consult  Post Acute Care Choice:    Choice offered to:     DME Arranged:    DME Agency:     HH Arranged:    [place] Agency:     Status of Service:  Completed, signed off  Medicare Important Message Given:  Yes Date Medicare IM Given:    Medicare IM give by:    Date Additional Medicare IM Given:    Additional Medicare Important Message give by:     If discussed at [place] of Stay Meetings, dates discussed:    Additional Comments: CM consult for medicaid assistance for long term care.  Per CSW, she has already had conversation with pt daughter on how to apply for medicaid.  Pt currently has [employer] so unable to help financially.  Lynnell Catalan, RN 10/02/2015, 10:20 AM

## 2015-10-02 NOTE — Progress Notes (Addendum)
PROGRESS NOTE    Sandra Flowers  [id number]  DOB: 08/20/1924  DOA: 09/26/2015 PCP: Merlene Laughter, MD Outpatient Specialists:   Hospital course: 82 year old female, PMH of HTN, HLD, mostly wheelchair bound for the last month at an assisted living facility, sacral wounds noted 2 weeks prior to admission by patient's daughter, the sacral wounds progressively got worse and patient noted to become more sleepy, lethargic and complained of right hip pain. Associated poor oral intake. Foley catheter placed 3 days PTA for urinary dIversion from wound. She was admitted for management of infected sacral pressure ulcer.   Assessment & Plan:   Infected sacral pressure ulcer (stage IV pressure injury) - Gen. surgery was consulted and patient underwent I&D on 09/27/15 - Cultures grew Morganella. Empirically started vancomycin and Zosyn were discontinued. Patient was transitioned to ciprofloxacin to complete an additional 7 days treatment. - PT did not recommend hydrotherapy at discharge. - Patient was planned for discharge to nursing facility on 3/22. However as per nursing, patient has significant pain issues at the pressure ulcer site and was on frequent doses of IV pain medications up to yesterday. Try to control pain with oral pain medications prior to discharge.  UTI - Urine culture showed insignificant growth. Completed antibiotics for this indication  Dehydration - Resolved after IV fluids  Acute on stage II chronic kidney disease - Acute kidney injury resolved.  Anemia of chronic disease/? Iron deficiency/ABLA - Hemoglobin dropped to a low of 6.4 during this hospitalization. Status post 2 units PRBC transfusion on 3/19. Hemoglobin stable in the 9 g range for the last 2 days.  Leukocytosis - Resolved.  Adult failure to thrive - Likely multifactorial. She sustained a fall in January followed by prolonged hospitalization (2 weeks') and then to rehabilitation for a month and  then to ALF. - Discussed extensively with patient's daughter who wishes to pursue rehabilitation at [facility] and will decide on palliative care consultation if needed in the future.   DVT prophylaxis: SCDs Code Status: Full Family Communication: discussed with daughter Ms. Erin Hearing. Updated care and answered questions. Disposition Plan: DC to SNF when medically stable   Consultants:  General surgery  Procedures:  Incision and drainage of sacral decubitus on 3/17  Antimicrobials:  IV Zosyn 3/16 > 3/20  IV vancomycin 3/16 > 3/19  Cipro 3/21 > 3/22   Subjective: Patient herself denied complaints. As per RN, patient was on frequent doses of IV pain medications up to yesterday. Significant pain on dressing changes.  Objective: Filed Vitals:   10/01/15 1340 10/01/15 2155 10/02/15 0547 10/02/15 1523  BP: 118/60 131/80 131/87 140/67  Pulse: 97 100 97 94  Temp: 98.1 F (36.7 C) 97.5 F (36.4 C) 98.5 F (36.9 C) 97.6 F (36.4 C)  TempSrc: Oral Oral Oral Oral  Resp: 18 20 18 20   Height:      Weight:      SpO2: 96% 95% 97% 96%    Intake/Output Summary (Last 24 hours) at 10/02/15 1752 Last data filed at 10/02/15 1406  Gross per 24 hour  Intake  591.5 ml  Output    750 ml  Net -158.5 ml   Filed Weights   09/26/15 1252 09/26/15 1732  Weight: 72.576 kg (160 lb) 72.6 kg (160 lb 0.9 oz)    Exam:  General exam: Pleasant elderly female, frail, lying comfortably supine in bed. Does not look septic or toxic. Respiratory system: Clear. No increased work of breathing. Cardiovascular system: S1 & S2 heard,  RRR. No JVD, murmurs, gallops, clicks or pedal edema. Gastrointestinal system: Abdomen is nondistended, soft and nontender. Normal bowel sounds heard. Foley catheter +. Central nervous system: Alert and oriented. No focal neurological deficits. Extremities: Symmetric 5 x 5 power.   Data Reviewed: Basic Metabolic Panel:  Recent Labs Lab 09/26/15 1138 09/26/15 1225  09/27/15 0339 09/29/15 0506 09/30/15 0345  NA 139 143 141 143 143  K 3.6 3.6 3.7 3.6 3.5  CL 102 106 107 110 111  CO2  --  23 23 24  21*  GLUCOSE 124* 125* 89 87 108*  BUN 39* 45* 42* 43* 41*  CREATININE 1.00 1.17* 1.08* 0.99 0.85  CALCIUM  --  8.9 8.5* 8.6* 8.6*   Liver Function Tests:  Recent Labs Lab 09/26/15 1225  AST 37  ALT 19  ALKPHOS 97  BILITOT 0.5  PROT 6.2*  ALBUMIN 2.4*   No results for input(s): LIPASE, AMYLASE in the last 168 hours. No results for input(s): AMMONIA in the last 168 hours. CBC:  Recent Labs Lab 09/26/15 1138 09/26/15 1225 09/29/15 0506 09/30/15 0345 10/01/15 0856  WBC  --  13.1* 11.1* 9.6 9.5  NEUTROABS  --  11.5*  --   --   --   HGB 7.8* 7.8* 6.4* 9.4* 9.6*  HCT 23.0* 24.3* 21.0* 29.0* 30.5*  MCV  --  94.9 101.4* 91.8 96.8  PLT  --  302 262 258 287   Cardiac Enzymes: No results for input(s): CKTOTAL, CKMB, CKMBINDEX, TROPONINI in the last 168 hours. BNP (last 3 results) No results for input(s): PROBNP in the last 8760 hours. CBG: No results for input(s): GLUCAP in the last 168 hours.  Recent Results (from the past 240 hour(s))  Urine culture     Status: None   Collection Time: 09/26/15  2:09 PM  Result Value Ref Range Status   Specimen Description URINE, CATHETERIZED  Final   Special Requests NONE  Final   Culture   Final    2,000 COLONIES/mL INSIGNIFICANT GROWTH Performed at [facility]    Report Status 09/27/2015 FINAL  Final  Anaerobic culture     Status: None (Preliminary result)   Collection Time: 09/27/15 10:48 AM  Result Value Ref Range Status   Specimen Description WOUND DECUBITIS ULCER  Final   Special Requests NONE  Final   Gram Stain PENDING  Incomplete   Culture   Final    NO ANAEROBES ISOLATED; CULTURE IN PROGRESS FOR 5 DAYS Performed at [facility]    Report Status PENDING  Incomplete  Tissue culture     Status: None   Collection Time: 09/27/15 10:50 AM  Result Value Ref Range Status     Specimen Description TISSUE DECUBITIS ULCER  Final   Special Requests NONE  Final   Gram Stain   Final    NO WBC SEEN ABUNDANT GRAM POSITIVE COCCI IN PAIRS MODERATE GRAM NEGATIVE RODS Performed at [facility]    Culture   Final    MODERATE MORGANELLA MORGANII Note: IMRPPM Performed at [facility]    Report Status 09/30/2015 FINAL  Final   Organism ID, Bacteria MORGANELLA MORGANII  Final      Susceptibility   Morganella morganii - MIC*    AMPICILLIN >=32 RESISTANT Resistant     AMPICILLIN/SULBACTAM 16 INTERMEDIATE Intermediate     CEFAZOLIN >=64 RESISTANT Resistant     CEFEPIME <=1 SENSITIVE Sensitive     CEFTAZIDIME <=1 SENSITIVE Sensitive     CEFTRIAXONE <=  1 SENSITIVE Sensitive     CIPROFLOXACIN <=0.25 SENSITIVE Sensitive     GENTAMICIN <=1 SENSITIVE Sensitive     IMIPENEM 8 RESISTANT Resistant     PIP/TAZO <=4 SENSITIVE Sensitive     TOBRAMYCIN <=1 SENSITIVE Sensitive     TRIMETH/SULFA Value in next row Sensitive      <=20 SENSITIVE(NOTE)    * MODERATE MORGANELLA MORGANII  MRSA PCR Screening     Status: None   Collection Time: 09/27/15  4:31 PM  Result Value Ref Range Status   MRSA by PCR NEGATIVE NEGATIVE Final    Comment:        The GeneXpert MRSA Assay (FDA approved for NASAL specimens only), is one component of a comprehensive MRSA colonization surveillance program. It is not intended to diagnose MRSA infection nor to guide or monitor treatment for MRSA infections.          Studies: No results found.      Scheduled Meds: . sodium chloride   Intravenous Once  . ciprofloxacin  500 mg Oral BID  . feeding supplement (PRO-STAT SUGAR FREE 64)  30 mL Oral TID  . ferrous sulfate  325 mg Oral Q breakfast  . saccharomyces boulardii  250 mg Oral BID  . senna-docusate  2 tablet Oral QHS  . vitamin C  500 mg Oral Daily  . zinc sulfate  220 mg Oral Daily   Continuous Infusions: . sodium chloride 10 mL/hr at 09/30/15 1004    Active  Problems:   Decubitus ulcer, infected    Time spent: 25 minutes.    Vernell Leep, MD, FACP, FHM. [facility] Pager [phone]  If 7PM-7AM, please contact night-coverage [email or web address] Password [facility] 10/02/2015, 5:52 PM    LOS: 6 days

## 2015-10-03 DIAGNOSIS — N189 Chronic kidney disease, unspecified: Secondary | ICD-10-CM

## 2015-10-03 LAB — ANAEROBIC CULTURE: GRAM STAIN: NONE SEEN

## 2015-10-03 MED ORDER — OXYCODONE-ACETAMINOPHEN 5-325 MG PO TABS: 1.0000 | ORAL_TABLET | Freq: Four times a day (QID) | ORAL | Status: DC | PRN

## 2015-10-03 MED ORDER — CIPROFLOXACIN HCL 500 MG PO TABS: 500.0000 mg | ORAL_TABLET | Freq: Two times a day (BID) | ORAL | Status: DC

## 2015-10-03 NOTE — Progress Notes (Signed)
Premedicated pt for 2200 dressing change with percocet 2 tabs 1 hour prior. Pt tolerated dsg change fairly well, daughter said it was the best she had done thus far. Continue to monitor. Hortencia Conradi RN

## 2015-10-03 NOTE — Progress Notes (Signed)
6 Days Post-Op  Subjective: She looks good, Hydrotherapy not started because she was going to a facility without that capability.  I would use it while she was here This picture dose not show it but she has some white fibrous tissue over the midline that could be cleaned up with this.     Objective: Vital signs in last 24 hours: Temp:  [97.6 F (36.4 C)-99.1 F (37.3 C)] 98.2 F (36.8 C) (03/23 0628) Pulse Rate:  [86-94] 91 (03/23 0628) Resp:  [20] 20 (03/23 0628) BP: (121-140)/(62-79) 121/62 mmHg (03/23 0628) SpO2:  [95 %-98 %] 98 % (03/23 0628) Last BM Date: 10/02/15  Intake/Output from previous day: 03/22 0701 - 03/23 0700 In: 420 [P.O.:420] Out: 880 [Urine:880] Intake/Output this shift: Total I/O In: 240 [P.O.:240] Out: -   General appearance: alert, cooperative and no distress Skin: Skin color, texture, turgor normal. No rashes or lesions or see picture above. 98% is clean pink viable tissue.  Some yellow-white fibrous tissue over bone that should clean up with time. Lab Results:   Recent Labs  10/01/15 0856  WBC 9.5  HGB 9.6*  HCT 30.5*  PLT 287    BMET No results for input(s): NA, K, CL, CO2, GLUCOSE, BUN, CREATININE, CALCIUM in the last 72 hours. PT/INR No results for input(s): LABPROT, INR in the last 72 hours.   Recent Labs Lab 09/26/15 1225  AST 37  ALT 19  ALKPHOS 97  BILITOT 0.5  PROT 6.2*  ALBUMIN 2.4*     Lipase  No results found for: LIPASE   Studies/Results: No results found.  Medications: . ciprofloxacin  500 mg Oral BID  . feeding supplement (PRO-STAT SUGAR FREE 64)  30 mL Oral TID  . ferrous sulfate  325 mg Oral Q breakfast  . saccharomyces boulardii  250 mg Oral BID  . senna-docusate  2 tablet Oral QHS  . vitamin C  500 mg Oral Daily  . zinc sulfate  220 mg Oral Daily    Assessment/Plan Decubitus Ulcer with debridement by Dr. Marcello Moores 09/27/15. SCD- DVT Antibiotics: 5 days of Zosyn, completed 3/20, now on Cipro day  3  Plan:  From our standpoint no need for surgical debridement.  She need frequent dressing changes, and I would use Hydro Rx while here.         LOS: 7 days    Sandra Flowers 10/03/2015

## 2015-10-03 NOTE — Clinical Social Work Placement (Signed)
   CLINICAL SOCIAL WORK PLACEMENT  NOTE  Date:  10/03/2015  Patient Details  Name: Sandra Flowers MRN: [id number] Date of Birth: Oct 20, 1924  Clinical Social Work is seeking post-discharge placement for this patient at the [place] level of care (*CSW will initial, date and re-position this form in  chart as items are completed):  Yes   Patient/family provided with [facility] Work Department's list of facilities offering this level of care within the geographic area requested by the patient (or if unable, by the patient's family).  Yes   Patient/family informed of their freedom to choose among providers that offer the needed level of care, that participate in Medicare, Medicaid or managed care program needed by the patient, have an available bed and are willing to accept the patient.  Yes   Patient/family informed of [facility]'s ownership interest in [facility] and [facility], as well as of the fact that they are under no obligation to receive care at these facilities.  PASRR submitted to EDS on       PASRR number received on       Existing PASRR number confirmed on 10/01/15     FL2 transmitted to all facilities in geographic area requested by pt/family on 10/01/15     FL2 transmitted to all facilities within larger geographic area on       Patient informed that his/her managed care company has contracts with or will negotiate with certain facilities, including the following:        Yes   Patient/family informed of bed offers received.  Patient chooses bed at [facility]     Physician recommends and patient chooses bed at      Patient to be transferred to [facility] on 10/03/15.  Patient to be transferred to facility by ambulance Corey Harold)     Patient family notified on 10/03/15 of transfer.  Name of family member notified:  pt notified at bedside and pt daughter, Nicola Girt notified via telephone.      PHYSICIAN Please sign FL2     Additional Comment:    _______________________________________________ Ladell Pier, LCSW 10/03/2015, 3:26 PM

## 2015-10-03 NOTE — Progress Notes (Signed)
Pt for discharge to [facility].   CSW facilitated pt discharge needs including contacting facility, faxing pt discharge information via epic hub, discussing with pt at bedside and pt daughter, Nicola Girt via telephone, providing RN phone number to call report, and arranging ambulance transport via [facility] for pt to [facility].   Pt daughter requested to be notified when transport arrived to transport pt. CSW asked RN to contact pt daughter once [facility]AR arrived.  No further social work needs identified at this time.  CSW signing off.   Alison Murray, MSW, [place] Work [phone]

## 2015-10-03 NOTE — Progress Notes (Signed)
CSW continuing to follow.   CSW spoke with pt daughter, Ms. Erin Hearing via telephone. CSW clarified with pt daughter pt code status as there is documentation that pt is a DNR, but epic order still remained full code. Pt daughter states that pt has expressed her wishes to be a Do No Resuscitate (DNR) and advanced directive is at bedside to support this.   CSW discussed with pt daughter discharge to [facility]. CSW discussed with pt daughter that MD and surgeon notes state, "Hydrotherapy would be beneficial if available, but not absolutely required. Dressing changes should be at least BID." Pt daughter has chosen [facility] as pt has been there in the past and had a positive experience at [facility] and enjoyed the food at facility. CSW notified pt daughter that [facility] does not provide hydrotherapy, but can provide the dressing changes that pt has been receiving in the hospital as hydrotherapy was not started in the hospital. CSW discussed that there are other SNF facilities that provide hydrotherapy, but unfortunately [facility] is not one of those facilities. Pt daughter states that she understands that hydrotherapy is not a service that [facility] provides, but agreeable to pt going to [facility] and continuing to receive the wound care regimen that the hospital has been administering. CSW confirmed with MD that pt is safe to discharge to [facility] given facility does not provide hydrotherapy, but can provide the daily dressing changes.   CSW to facilitate pt discharge needs this afternoon.  Alison Murray, MSW, [place] Work [phone]

## 2015-10-03 NOTE — Discharge Summary (Addendum)
Physician Discharge Summary  Sandra Flowers  [id number]  DOB: 03-24-1925  DOA: 09/26/2015  PCP: Merlene Laughter, MD  Admit date: 09/26/2015 Discharge date: 10/03/2015  Time spent: Greater than 30 minutes  Recommendations for Outpatient Follow-up:  1. M.D. at SNF in 3 days with repeat labs (CBC & BMP). 2. Hydrotherapy to sacral wound, if available at SNF. 3. Sacral decubitus wound care as outlined below.  Discharge Diagnoses:  Active Problems:   Decubitus ulcer, infected   Discharge Condition: Improved & Stable  Diet recommendation: Heart healthy diet.  Filed Weights   09/26/15 1252 09/26/15 1732  Weight: 72.576 kg (160 lb) 72.6 kg (160 lb 0.9 oz)    History of present illness:  82 year old female, PMH of HTN, HLD, mostly wheelchair bound for the last month at an assisted living facility, sacral wounds noted 2 weeks prior to admission by patient's daughter, the sacral wounds progressively got worse and patient noted to become more sleepy, lethargic and complained of right hip pain. Associated poor oral intake. Foley catheter placed 3 days PTA for urinary dIversion from wound. She was admitted for management of infected sacral pressure ulcer.  Hospital Course:   Infected sacral pressure ulcer (stage IV pressure injury) - Gen. surgery was consulted and patient underwent I&D on 09/27/15 - Cultures grew Morganella morganii. Empirically started vancomycin and Zosyn were discontinued. Patient was transitioned to ciprofloxacin to complete an additional 7 days treatment-end date 10/07/15. - PT did not recommend hydrotherapy at discharge. - Patient was planned for discharge to nursing facility on 3/22 however was postponed by a day until her pain was adequately controlled on oral medications which now 8 days. - General surgery follow-up appreciated. Discussed with surgery. Okay to discharge to SNF for continued wound care and management. Surgery indicates that hydrotherapy would be  beneficial if available but not absolutely required-as per social work, hydrotherapy not available at current SNF. - Discussed extensively with patient's daughter on 3/22 and advised her that given patient's advanced age, poor nutritional status, nonambulatory status for the last couple of weeks, this wound may heal very slowly if it heals at all and if she does not make improvement and may consider palliative care consultation. Daughter indicated that patient had done quite well at the SNF that she is planning to go and hopes for improved nutritional status and wound healing but is aware of seeking palliative care consultation if needed. - Foley catheter which was placed at the SNF has been continued to avoid soiling of sacral wound. Defer to M.D. at SNF regarding timing of discontinuing this catheter.  UTI - Urine culture showed insignificant growth. Completed antibiotics for this indication  Dehydration - Resolved after IV fluids  Acute on stage II chronic kidney disease - Acute kidney injury resolved.  Anemia of chronic disease/? Iron deficiency/ABLA - Hemoglobin dropped to a low of 6.4 during this hospitalization. Status post 2 units PRBC transfusion on 3/19. Hemoglobin stable in the 9 g range for the last 2 days. Follow CBC at SNF in a couple of days.  Leukocytosis - Resolved.  Adult failure to thrive - Likely multifactorial. She sustained a fall in January followed by prolonged hospitalization (2 weeks') and then to rehabilitation for a month and then to ALF. - Discussed extensively with patient's daughter who wishes to pursue rehabilitation at [facility] and will decide on palliative care consultation if needed in the future.  Essential hypertension - ARB/HCTZ held in the hospital due to acute kidney injury. Blood pressures are  reasonably controlled off of medications at this time. Closely follow at SNF to determine if antihypertensives need to be initiated.  DO NOT RESUSCITATE -This was  confirmed with family.  Family Communication: discussed with daughter Ms. Erin Hearing on 3/22. Updated care and answered questions. She was aware that patient would be discharged today to SNF pending pain control.    Consultants:  General surgery  Procedures:  Incision and drainage of sacral decubitus on 3/17  Antimicrobials:  IV Zosyn 3/16 > 3/20  IV vancomycin 3/16 > 3/19  Cipro 3/21 > 3/27   Discharge Exam:  Complaints: As per nursing and patient, sacral area pain is much better controlled by premedication with oral pills. No other complaints or acute issues reported. Had BM this morning.  Filed Vitals:   10/02/15 0547 10/02/15 1523 10/02/15 2200 10/03/15 0628  BP: 131/87 140/67 130/79 121/62  Pulse: 97 94 86 91  Temp: 98.5 F (36.9 C) 97.6 F (36.4 C) 99.1 F (37.3 C) 98.2 F (36.8 C)  TempSrc: Oral Oral Oral Oral  Resp: 18 20 20 20   Height:      Weight:      SpO2: 97% 96% 95% 98%    General exam: Pleasant elderly female, frail, lying comfortably supine in bed. Does not look septic or toxic. Patient was examined with her female RN in room. Respiratory system: Clear. No increased work of breathing. Cardiovascular system: S1 & S2 heard, RRR. No JVD, murmurs, gallops, clicks or pedal edema. Gastrointestinal system: Abdomen is nondistended, soft and nontender. Normal bowel sounds heard. Foley catheter +. Central nervous system: Alert and oriented. No focal neurological deficits. Extremities: Symmetric 5 x 5 power. Skin: Examined sacral wound which appears clean without acute findings. It appears as per picture below.      Discharge Instructions      Discharge Instructions    Call MD for:  difficulty breathing, headache or visual disturbances    Complete by:  As directed      Call MD for:  persistant dizziness or light-headedness    Complete by:  As directed      Call MD for:  persistant nausea and vomiting    Complete by:  As directed      Call  MD for:  redness, tenderness, or signs of infection (pain, swelling, redness, odor or green/yellow discharge around incision site)    Complete by:  As directed      Call MD for:  severe uncontrolled pain    Complete by:  As directed      Call MD for:  temperature >100.4    Complete by:  As directed      Diet - low sodium heart healthy    Complete by:  As directed      Discharge instructions    Complete by:  As directed   May continue aggrenox on discharge but continue to monitor CBC episodically to make sure it is stable. Hgb is 9.6 on discharge.     Discharge wound care:    Complete by:  As directed   4 inch Kerlix moistened with normal saline, wet-to-dry dressing to sacral wound twice a day and as needed for soiling. Hydrotherapy to wound at SNF if available.     Increase activity slowly    Complete by:  As directed             Medication List    STOP taking these medications        acetaminophen 325  MG tablet  Commonly known as:  TYLENOL     amoxicillin-clavulanate 875-125 MG tablet  Commonly known as:  AUGMENTIN     collagenase ointment  Commonly known as:  SANTYL     losartan-hydrochlorothiazide 100-25 MG tablet  Commonly known as:  HYZAAR     metroNIDAZOLE 500 MG tablet  Commonly known as:  FLAGYL      TAKE these medications        BENGAY GREASELESS EX  Apply 1 application topically 2 (two) times daily. To Right hip.     ciprofloxacin 500 MG tablet  Commonly known as:  CIPRO  Take 1 tablet (500 mg total) by mouth 2 (two) times daily. Discontinue after 10/07/2015 doses.     dipyridamole-aspirin 200-25 MG 12hr capsule  Commonly known as:  AGGRENOX  Take 1 capsule by mouth 2 (two) times daily.     feeding supplement (PRO-STAT SUGAR FREE 64) Liqd  Take 30 mLs by mouth 3 (three) times daily.     ferrous sulfate 325 (65 FE) MG tablet  Take 1 tablet (325 mg total) by mouth daily with breakfast.     oxyCODONE-acetaminophen 5-325 MG tablet  Commonly known as:   PERCOCET/ROXICET  Take 1-2 tablets by mouth every 6 (six) hours as needed for moderate pain or severe pain.     Polyethyl Glycol-Propyl Glycol 0.4-0.3 % Gel ophthalmic gel  Commonly known as:  SYSTANE  Place 1 application into both eyes daily as needed (drye eye syndrome.).     protein supplement Powd  Take 1 scoop by mouth 3 (three) times daily with meals.     saccharomyces boulardii 250 MG capsule  Commonly known as:  FLORASTOR  Take 250 mg by mouth 2 (two) times daily.     senna-docusate 8.6-50 MG tablet  Commonly known as:  Senokot-S  Take 2 tablets by mouth at bedtime.     vitamin C 500 MG tablet  Commonly known as:  ASCORBIC ACID  Take 500 mg by mouth daily.     zinc sulfate 220 MG capsule  Take 220 mg by mouth daily.       Follow-up Information    Follow up with [facility] .   Specialty:  [place] information:   [address] [place] [phone]      Get Medicines reviewed and adjusted: Please take all your medications with you for your next visit with your Primary MD  Please request your Primary MD to go over all hospital tests and procedure/radiological results at the follow up. Please ask your Primary MD to get all Hospital records sent to his/her office.  If you experience worsening of your admission symptoms, develop shortness of breath, life threatening emergency, suicidal or homicidal thoughts you must seek medical attention immediately by calling 911 or calling your MD immediately if symptoms less severe.  You must read complete instructions/literature along with all the possible adverse reactions/side effects for all the Medicines you take and that have been prescribed to you. Take any new Medicines after you have completely understood and accept all the possible adverse reactions/side effects.   Do not drive when taking pain medications.   Do not take more than prescribed Pain,  Sleep and Anxiety Medications  Special Instructions: If you have smoked or chewed Tobacco in the last 2 yrs please stop smoking, stop any regular Alcohol and or any Recreational drug use.  Wear Seat belts while driving.  Please  note  You were cared for by a hospitalist during your hospital stay. Once you are discharged, your primary care physician will handle any further medical issues. Please note that NO REFILLS for any discharge medications will be authorized once you are discharged, as it is imperative that you return to your primary care physician (or establish a relationship with a primary care physician if you do not have one) for your aftercare needs so that they can reassess your need for medications and monitor your lab values.    The results of significant diagnostics from this hospitalization (including imaging, microbiology, ancillary and laboratory) are listed below for reference.    Significant Diagnostic Studies: No results found.  Microbiology: Recent Results (from the past 240 hour(s))  Urine culture     Status: None   Collection Time: 09/26/15  2:09 PM  Result Value Ref Range Status   Specimen Description URINE, CATHETERIZED  Final   Special Requests NONE  Final   Culture   Final    2,000 COLONIES/mL INSIGNIFICANT GROWTH Performed at [facility]    Report Status 09/27/2015 FINAL  Final  Anaerobic culture     Status: None (Preliminary result)   Collection Time: 09/27/15 10:48 AM  Result Value Ref Range Status   Specimen Description WOUND DECUBITIS ULCER  Final   Special Requests NONE  Final   Gram Stain PENDING  Incomplete   Culture   Final    NO ANAEROBES ISOLATED; CULTURE IN PROGRESS FOR 5 DAYS Performed at [facility]    Report Status PENDING  Incomplete  Tissue culture     Status: None   Collection Time: 09/27/15 10:50 AM  Result Value Ref Range Status   Specimen Description TISSUE DECUBITIS ULCER  Final   Special Requests NONE   Final   Gram Stain   Final    NO WBC SEEN ABUNDANT GRAM POSITIVE COCCI IN PAIRS MODERATE GRAM NEGATIVE RODS Performed at [facility]    Culture   Final    MODERATE MORGANELLA MORGANII Note: IMRPPM Performed at [facility]    Report Status 09/30/2015 FINAL  Final   Organism ID, Bacteria MORGANELLA MORGANII  Final      Susceptibility   Morganella morganii - MIC*    AMPICILLIN >=32 RESISTANT Resistant     AMPICILLIN/SULBACTAM 16 INTERMEDIATE Intermediate     CEFAZOLIN >=64 RESISTANT Resistant     CEFEPIME <=1 SENSITIVE Sensitive     CEFTAZIDIME <=1 SENSITIVE Sensitive     CEFTRIAXONE <=1 SENSITIVE Sensitive     CIPROFLOXACIN <=0.25 SENSITIVE Sensitive     GENTAMICIN <=1 SENSITIVE Sensitive     IMIPENEM 8 RESISTANT Resistant     PIP/TAZO <=4 SENSITIVE Sensitive     TOBRAMYCIN <=1 SENSITIVE Sensitive     TRIMETH/SULFA Value in next row Sensitive      <=20 SENSITIVE(NOTE)    * MODERATE MORGANELLA MORGANII  MRSA PCR Screening     Status: None   Collection Time: 09/27/15  4:31 PM  Result Value Ref Range Status   MRSA by PCR NEGATIVE NEGATIVE Final    Comment:        The GeneXpert MRSA Assay (FDA approved for NASAL specimens only), is one component of a comprehensive MRSA colonization surveillance program. It is not intended to diagnose MRSA infection nor to guide or monitor treatment for MRSA infections.      Labs: Basic Metabolic Panel:  Recent Labs Lab 09/27/15 0339 09/29/15 0506 09/30/15 0345  NA  141 143 143  K 3.7 3.6 3.5  CL 107 110 111  CO2 23 24 21*  GLUCOSE 89 87 108*  BUN 42* 43* 41*  CREATININE 1.08* 0.99 0.85  CALCIUM 8.5* 8.6* 8.6*   Liver Function Tests: No results for input(s): AST, ALT, ALKPHOS, BILITOT, PROT, ALBUMIN in the last 168 hours. No results for input(s): LIPASE, AMYLASE in the last 168 hours. No results for input(s): AMMONIA in the last 168 hours. CBC:  Recent Labs Lab 09/29/15 0506 09/30/15 0345  10/01/15 0856  WBC 11.1* 9.6 9.5  HGB 6.4* 9.4* 9.6*  HCT 21.0* 29.0* 30.5*  MCV 101.4* 91.8 96.8  PLT 262 258 287   Cardiac Enzymes: No results for input(s): CKTOTAL, CKMB, CKMBINDEX, TROPONINI in the last 168 hours. BNP: BNP (last 3 results) No results for input(s): BNP in the last 8760 hours.  ProBNP (last 3 results) No results for input(s): PROBNP in the last 8760 hours.  CBG: No results for input(s): GLUCAP in the last 168 hours.     Signed:  Vernell Leep, MD, FACP, FHM. [facility] Pager [phone]  If 7PM-7AM, please contact night-coverage [email or web address] Password [facility] 10/03/2015, 12:41 PM

## 2015-10-03 NOTE — Progress Notes (Signed)
Attempted to call report RN at [facility]. Per Malachy Mood, RN was unavailable to get report at that time. This RN gave number for nurse to call back when available.

## 2015-11-27 ENCOUNTER — Emergency Department (HOSPITAL_COMMUNITY): Payer: Medicare Other

## 2015-11-27 ENCOUNTER — Emergency Department (HOSPITAL_COMMUNITY)
Admission: EM | Admit: 2015-11-27 | Discharge: 2015-11-27 | Disposition: A | Discharge status: Home / Self Care | Payer: Medicare Other | Attending: Emergency Medicine | Admitting: Emergency Medicine

## 2015-11-27 ENCOUNTER — Encounter (HOSPITAL_COMMUNITY): Payer: Self-pay | Admitting: Emergency Medicine

## 2015-11-27 DIAGNOSIS — M1990 Unspecified osteoarthritis, unspecified site: Secondary | ICD-10-CM | POA: Insufficient documentation

## 2015-11-27 DIAGNOSIS — W19XXXA Unspecified fall, initial encounter: Secondary | ICD-10-CM | POA: Insufficient documentation

## 2015-11-27 DIAGNOSIS — Y939 Activity, unspecified: Secondary | ICD-10-CM | POA: Diagnosis not present

## 2015-11-27 DIAGNOSIS — Y999 Unspecified external cause status: Secondary | ICD-10-CM | POA: Diagnosis not present

## 2015-11-27 DIAGNOSIS — Z79891 Long term (current) use of opiate analgesic: Secondary | ICD-10-CM | POA: Insufficient documentation

## 2015-11-27 DIAGNOSIS — Z792 Long term (current) use of antibiotics: Secondary | ICD-10-CM | POA: Insufficient documentation

## 2015-11-27 DIAGNOSIS — Z7982 Long term (current) use of aspirin: Secondary | ICD-10-CM | POA: Insufficient documentation

## 2015-11-27 DIAGNOSIS — E785 Hyperlipidemia, unspecified: Secondary | ICD-10-CM | POA: Diagnosis not present

## 2015-11-27 DIAGNOSIS — Z79899 Other long term (current) drug therapy: Secondary | ICD-10-CM | POA: Insufficient documentation

## 2015-11-27 DIAGNOSIS — N390 Urinary tract infection, site not specified: Secondary | ICD-10-CM | POA: Insufficient documentation

## 2015-11-27 DIAGNOSIS — I10 Essential (primary) hypertension: Secondary | ICD-10-CM | POA: Diagnosis not present

## 2015-11-27 DIAGNOSIS — Y929 Unspecified place or not applicable: Secondary | ICD-10-CM | POA: Insufficient documentation

## 2015-11-27 DIAGNOSIS — D329 Benign neoplasm of meninges, unspecified: Secondary | ICD-10-CM | POA: Insufficient documentation

## 2015-11-27 LAB — URINALYSIS, ROUTINE W REFLEX MICROSCOPIC
Bilirubin Urine: NEGATIVE
Glucose, UA: NEGATIVE mg/dL
Ketones, ur: NEGATIVE mg/dL
NITRITE: NEGATIVE
PROTEIN: 30 mg/dL — AB
SPECIFIC GRAVITY, URINE: 1.023 (ref 1.005–1.030)
pH: 5 (ref 5.0–8.0)

## 2015-11-27 LAB — URINE MICROSCOPIC-ADD ON

## 2015-11-27 MED ORDER — ACETAMINOPHEN 325 MG PO TABS
650.0000 mg | ORAL_TABLET | Freq: Once | ORAL | Status: AC
  Administered 2015-11-27: 650 mg via ORAL
  Filled 2015-11-27: qty 2

## 2015-11-27 MED ORDER — CEPHALEXIN 250 MG PO CAPS: 250.0000 mg | ORAL_CAPSULE | Freq: Four times a day (QID) | ORAL | Status: DC

## 2015-11-27 NOTE — ED Provider Notes (Signed)
CSN: [id number]     Arrival date & time 11/27/15  1451 History   First MD Initiated Contact with Patient 11/27/15 1456     Chief Complaint  Patient presents with  . Fall     (Consider location/radiation/quality/duration/timing/severity/associated sxs/prior Treatment) HPI   Sandra Flowers is a 82 y.o. female sent here for evaluation after fall, from her facility. She was really found on the floor. She also fell a couple weeks ago injuring her face. There is a report that she currently has a UTI.  Level V caveat-dementia   Past Medical History  Diagnosis Date  . Hypertension   . Hyperlipidemia   . Meningioma ([place]) y-10  . Arthritis   . Hearing loss m-6   Past Surgical History  Procedure Laterality Date  . Cholecystectomy    . Abdominal hysterectomy    . Debridment of decubitus ulcer N/A 09/27/2015    Procedure: DEBRIDMENT OF DECUBITUS ULCER;  Surgeon: Leighton Ruff, MD;  Location: [facility];  Service: General;  Laterality: N/A;   Family History  Problem Relation Age of Onset  . Heart disease Mother   . Hypertension Son   . Cancer Maternal Aunt     lung  . Cancer Paternal Aunt     liver   Social History  Substance Use Topics  . Smoking status: Never Smoker   . Smokeless tobacco: Never Used  . Alcohol Use: No   OB History    No data available     Review of Systems  Unable to perform ROS: Dementia      Allergies  Review of patient's allergies indicates no known allergies.  Home Medications   Prior to Admission medications   Medication Sig Start Date End Date Taking? Authorizing Provider  Amino Acids-Protein Hydrolys (FEEDING SUPPLEMENT, PRO-STAT SUGAR FREE 64,) LIQD Take 30 mLs by mouth 3 (three) times daily. 10/01/15  Yes Robbie Lis, MD  dipyridamole-aspirin (AGGRENOX) 200-25 MG 12hr capsule Take 1 capsule by mouth 2 (two) times daily.   Yes Historical Provider, MD  ferrous sulfate 325 (65 FE) MG tablet Take 1 tablet (325 mg total) by mouth daily with  breakfast. 10/01/15  Yes Robbie Lis, MD  Multiple Vitamin (MULTIVITAMIN WITH MINERALS) TABS tablet Take 1 tablet by mouth daily.   Yes Historical Provider, MD  oxyCODONE (OXYCONTIN) 10 mg 12 hr tablet Take 10 mg by mouth every 12 (twelve) hours.   Yes Historical Provider, MD  oxyCODONE-acetaminophen (PERCOCET/ROXICET) 5-325 MG tablet Take 1-2 tablets by mouth every 6 (six) hours as needed for moderate pain or severe pain. 10/03/15  Yes Modena Jansky, MD  Polyethyl Glycol-Propyl Glycol (SYSTANE) 0.4-0.3 % GEL ophthalmic gel Place 1 application into both eyes daily as needed (dry eye syndrome.).    Yes Historical Provider, MD  polyvinyl alcohol (LIQUIFILM TEARS) 1.4 % ophthalmic solution Place 1 drop into both eyes as needed for dry eyes.   Yes Historical Provider, MD  saccharomyces boulardii (FLORASTOR) 250 MG capsule Take 250 mg by mouth 2 (two) times daily.   Yes Historical Provider, MD  senna-docusate (SENOKOT-S) 8.6-50 MG tablet Take 2 tablets by mouth at bedtime.   Yes Historical Provider, MD  vitamin C (ASCORBIC ACID) 500 MG tablet Take 500 mg by mouth daily.   Yes Historical Provider, MD  zinc sulfate 220 MG capsule Take 220 mg by mouth daily at 6 PM.    Yes Historical Provider, MD  cephALEXin (KEFLEX) 250 MG capsule Take 1 capsule (250 mg total)  by mouth 4 (four) times daily. 11/27/15   Daleen Bo, MD  ciprofloxacin (CIPRO) 500 MG tablet Take 1 tablet (500 mg total) by mouth 2 (two) times daily. Discontinue after 10/07/2015 doses. Patient not taking: Reported on 11/27/2015 10/03/15   Modena Jansky, MD   BP 116/63 mmHg  Pulse 110  Temp(Src) 98.5 F (36.9 C) (Oral)  Resp 18  SpO2 95% Physical Exam  Constitutional: She appears well-developed.  Elderly, frail  HENT:  Head: Normocephalic.  Right Ear: External ear normal.  Left Ear: External ear normal.  Subacute injury, left periorbital consisting of ecchymosis, and old appearing wound, with high blood on it. No cranial or scalp  deformities.  Eyes: Conjunctivae and EOM are normal. Pupils are equal, round, and reactive to light.  Neck: Normal range of motion and phonation normal. Neck supple.  Cardiovascular: Normal rate, regular rhythm and normal heart sounds.   Pulmonary/Chest: Effort normal and breath sounds normal. No respiratory distress. She exhibits no bony tenderness.  Abdominal: Soft. There is no tenderness.  Musculoskeletal: Normal range of motion. She exhibits no edema or tenderness.  Neurological: She is alert. No cranial nerve deficit or sensory deficit. She exhibits normal muscle tone. Coordination normal.  Skin: Skin is warm, dry and intact.  Psychiatric: She has a normal mood and affect. Her behavior is normal.  Nursing note and vitals reviewed.   ED Course  Procedures (including critical care time)  Initial clinical impression- Fall mechanism unknown. Possible reinjury to face. Evaluate for metabolic and infectious processes. Images and labs ordered.   Medications - No data to display  Patient Vitals for the past 24 hrs:  BP Temp Temp src Pulse Resp SpO2  11/27/15 1903 116/63 mmHg - - 110 18 95 %  11/27/15 1700 (!) 113/101 mmHg - - (!) 37 17 98 %  11/27/15 1504 107/58 mmHg 98.5 F (36.9 C) Oral 61 16 100 %  11/27/15 1458 - - - - - 99 %    After CT imaging results returned, I informed the daughter of the findings and she states that the she has a known prior brain tumor, in the back of her head, for about 10 years.  CT imaging results, had discussed with neurosurgery, Dr. Sherwood Gambler. He feels that this represents a meningioma, and does not require treatment in this patient, at this time.  8:38 PM Reevaluation with update and discussion. After initial assessment and treatment, an updated evaluation reveals Clinical exam is unchanged. Nursing was unable to obtain blood, and after discussion with patient's daughter, we elected to obtain only urine for testing. Urinalysis indicates infection.  Culture was sent. Findings discussed with daughter, all questions were answered. Anjana Cheek L     Labs Review Labs Reviewed  URINALYSIS, ROUTINE W REFLEX MICROSCOPIC (NOT AT [facility]) - Abnormal; Notable for the following:    APPearance TURBID (*)    Hgb urine dipstick TRACE (*)    Protein, ur 30 (*)    Leukocytes, UA LARGE (*)    All other components within normal limits  URINE MICROSCOPIC-ADD ON - Abnormal; Notable for the following:    Squamous Epithelial / LPF 0-5 (*)    Bacteria, UA MANY (*)    All other components within normal limits  URINE CULTURE  BASIC METABOLIC PANEL  CBC WITH DIFFERENTIAL/PLATELET    Imaging Review Ct Head Wo Contrast  11/27/2015  CLINICAL DATA:  Confusion.  Unwitnessed fall EXAM: CT HEAD WITHOUT CONTRAST CT CERVICAL SPINE WITHOUT CONTRAST TECHNIQUE: Multidetector CT  imaging of the head and cervical spine was performed following the standard protocol without intravenous contrast. Multiplanar CT image reconstructions of the cervical spine were also generated. COMPARISON:  None. FINDINGS: CT HEAD FINDINGS There is moderate diffuse atrophy. There is vasogenic edema in the right cerebellum just to the right of midline which is effacing the fourth ventricle and displacing the fourth ventricle toward the left. This area of edema in the posterior fossa measures 2.9 x 2.4 cm. There is an area of what appears to be irregular calcification in the periphery of the right cerebellum measuring 3.2 x 1.9 cm. No other evidence of mass. No hemorrhage is evident. No subdural or epidural fluid collections are identified. There is no shift of the lateral and third ventricles from the midline. Note that there is effacement of the right quadrigeminal plate cistern posteriorly due to the edema from the lesion in the right cerebellum. No herniation is seen in this area, however. There is patchy small vessel disease throughout the centra semiovale bilaterally. There is a 7 mm probable  arachnoid cyst in the medial right temporal lobe. Right occipital lobe No acute infarct evident. Bony calvarium appears intact. Mastoid air cells are clear. No intraorbital lesions are evident. Cerebellar tonsils are mildly low lying. CT CERVICAL SPINE FINDINGS There is no demonstrable fracture. There is 3 mm of anterolisthesis of C3 on C4. There is 4 mm of anterolisthesis of C5 on C6. There is 2 mm of anterolisthesis of T1 on T2. These areas of anterolisthesis are felt to be due to underlying spondylosis. Prevertebral soft tissues and predental space regions are normal. There is mild disc space narrowing at C3-4 and C5-6. There is facet hypertrophy at multiple levels bilaterally. Facet hypertrophy is most marked at C4-5 on the left. The thyroid appears rather prominent. There is a focal area of increased attenuation in the left lobe of the thyroid measuring 6 x 6 mm, likely a colloid cyst. IMPRESSION: CT head: Vasogenic edema arising from the medial right cerebellum effacing the fourth ventricle as well as partially effacing the posterior right quadrigeminal plate cistern. The fourth ventricle is deviated slightly toward the left. The third and lateral ventricles are in the midline. Cerebellar tonsils are mildly low-lying. There is irregular calcification along the periphery of the mid upper right cerebellum. Suspect calcified meningioma in this area as most likely etiology. Elsewhere there is atrophy with periventricular small vessel disease. No acute infarct evident. There is a small arachnoid cyst in the medial right temporal lobe. Suspect neoplasm as most likely etiology in the right cerebellum causing the mass effect on the fourth ventricle and the right quadrigeminal plate cistern. Advise brain MR pre and post-contrast to further evaluate. MR also could be helpful to confirm that the area of concern in the right cerebellum represents a meningioma as opposed to calcifying mass of other etiology. CT cervical  spine: No fracture. Areas of spondylolisthesis likely due to underlying spondylosis. Multifocal osteoarthritic change. Critical Value/emergent results were called by telephone at the time of interpretation on 11/27/2015 at 4:13 pm to Dr. Daleen Bo , who verbally acknowledged these results. Electronically Signed   By: Lowella Grip III M.D.   On: 11/27/2015 16:16   Ct Cervical Spine Wo Contrast  11/27/2015  CLINICAL DATA:  Confusion.  Unwitnessed fall EXAM: CT HEAD WITHOUT CONTRAST CT CERVICAL SPINE WITHOUT CONTRAST TECHNIQUE: Multidetector CT imaging of the head and cervical spine was performed following the standard protocol without intravenous contrast. Multiplanar CT image reconstructions  of the cervical spine were also generated. COMPARISON:  None. FINDINGS: CT HEAD FINDINGS There is moderate diffuse atrophy. There is vasogenic edema in the right cerebellum just to the right of midline which is effacing the fourth ventricle and displacing the fourth ventricle toward the left. This area of edema in the posterior fossa measures 2.9 x 2.4 cm. There is an area of what appears to be irregular calcification in the periphery of the right cerebellum measuring 3.2 x 1.9 cm. No other evidence of mass. No hemorrhage is evident. No subdural or epidural fluid collections are identified. There is no shift of the lateral and third ventricles from the midline. Note that there is effacement of the right quadrigeminal plate cistern posteriorly due to the edema from the lesion in the right cerebellum. No herniation is seen in this area, however. There is patchy small vessel disease throughout the centra semiovale bilaterally. There is a 7 mm probable arachnoid cyst in the medial right temporal lobe. Right occipital lobe No acute infarct evident. Bony calvarium appears intact. Mastoid air cells are clear. No intraorbital lesions are evident. Cerebellar tonsils are mildly low lying. CT CERVICAL SPINE FINDINGS There is no  demonstrable fracture. There is 3 mm of anterolisthesis of C3 on C4. There is 4 mm of anterolisthesis of C5 on C6. There is 2 mm of anterolisthesis of T1 on T2. These areas of anterolisthesis are felt to be due to underlying spondylosis. Prevertebral soft tissues and predental space regions are normal. There is mild disc space narrowing at C3-4 and C5-6. There is facet hypertrophy at multiple levels bilaterally. Facet hypertrophy is most marked at C4-5 on the left. The thyroid appears rather prominent. There is a focal area of increased attenuation in the left lobe of the thyroid measuring 6 x 6 mm, likely a colloid cyst. IMPRESSION: CT head: Vasogenic edema arising from the medial right cerebellum effacing the fourth ventricle as well as partially effacing the posterior right quadrigeminal plate cistern. The fourth ventricle is deviated slightly toward the left. The third and lateral ventricles are in the midline. Cerebellar tonsils are mildly low-lying. There is irregular calcification along the periphery of the mid upper right cerebellum. Suspect calcified meningioma in this area as most likely etiology. Elsewhere there is atrophy with periventricular small vessel disease. No acute infarct evident. There is a small arachnoid cyst in the medial right temporal lobe. Suspect neoplasm as most likely etiology in the right cerebellum causing the mass effect on the fourth ventricle and the right quadrigeminal plate cistern. Advise brain MR pre and post-contrast to further evaluate. MR also could be helpful to confirm that the area of concern in the right cerebellum represents a meningioma as opposed to calcifying mass of other etiology. CT cervical spine: No fracture. Areas of spondylolisthesis likely due to underlying spondylosis. Multifocal osteoarthritic change. Critical Value/emergent results were called by telephone at the time of interpretation on 11/27/2015 at 4:13 pm to Dr. Daleen Bo , who verbally  acknowledged these results. Electronically Signed   By: Lowella Grip III M.D.   On: 11/27/2015 16:16   I have personally reviewed and evaluated these images and lab results as part of my medical decision-making.   EKG Interpretation None      MDM   Final diagnoses:  UTI (lower urinary tract infection)  Fall, initial encounter  Meningioma [facility])    Fall, with urinary tract infection. Incidental meningioma, apparently present for quite some time. This may be contributing to weakness or  dizziness, and therefore falling. Patient has end-stage dementia, is no CODE BLUE, and is not a candidate for surgical meningioma.  Nursing Notes Reviewed/ Care Coordinated Applicable Imaging Reviewed Interpretation of Laboratory Data incorporated into ED treatment  The patient appears reasonably screened and/or stabilized for discharge and I doubt any other medical condition or other [facility] requiring further screening, evaluation, or treatment in the ED at this time prior to discharge.  Plan: Home Medications- Keflex; Home Treatments- rest, be careful to avoid falling, and getting out of wheelchair without assistance.; return here if the recommended treatment, does not improve the symptoms; Recommended follow up- PCP follow-up one week.     Daleen Bo, MD 11/27/15 2042

## 2015-11-27 NOTE — ED Notes (Signed)
Urinary cathter bag changed in order to collect clean urine sample. When enough urine is available sample will be obtained.

## 2015-11-27 NOTE — ED Notes (Signed)
Urine sample still unavailable. The new bag doesn't contain enough for a sample.

## 2015-11-27 NOTE — ED Notes (Signed)
Unable to collect labs patient is not in room 

## 2015-11-27 NOTE — ED Notes (Signed)
MD made aware of the delay in lab work due to the inability to establish IV access.  Catheter bag has be changed for clean catch.

## 2015-11-27 NOTE — ED Notes (Addendum)
Per EMS, pt from [facility]. Pt was found laying on her side on the ground next to her bed. Facility reports quick advancement of increased confusion x 2 weeks.  Pt denies any injury.

## 2015-11-27 NOTE — ED Notes (Signed)
Blood draw unsuccessful RN notified. 

## 2015-11-27 NOTE — Progress Notes (Signed)
EDCM spoke to patient's daughter Nicola Girt at bedside, patient sleeping.  Patient's daughter reports patient lives at [facility] at skilled nursing level.  She reports patient has fallen twice within the last three weeks.

## 2015-11-27 NOTE — ED Notes (Signed)
IV team at bedside 

## 2015-11-27 NOTE — ED Notes (Signed)
MD at bedside for assessment with family present.

## 2015-11-27 NOTE — ED Notes (Signed)
MD at bedside. 

## 2015-11-27 NOTE — Discharge Instructions (Signed)
She may have a urinary tract infection. We are sending a culture to make sure. Start taking Keflex, to treat a urine tract infection. The CT scan indicated that she has a meningioma. This appears to be a chronic problem, but may have contributed to her falling. There is no need for further treatment of this at this time.  Urinary Tract Infection Urinary tract infections (UTIs) can develop anywhere along your urinary tract. Your urinary tract is your body's drainage system for removing wastes and extra water. Your urinary tract includes two kidneys, two ureters, a bladder, and a urethra. Your kidneys are a pair of bean-shaped organs. Each kidney is about the size of your fist. They are located below your ribs, one on each side of your spine. CAUSES Infections are caused by microbes, which are microscopic organisms, including fungi, viruses, and bacteria. These organisms are so small that they can only be seen through a microscope. Bacteria are the microbes that most commonly cause UTIs. SYMPTOMS  Symptoms of UTIs may vary by age and gender of the patient and by the location of the infection. Symptoms in young women typically include a frequent and intense urge to urinate and a painful, burning feeling in the bladder or urethra during urination. Older women and men are more likely to be tired, shaky, and weak and have muscle aches and abdominal pain. A fever may mean the infection is in your kidneys. Other symptoms of a kidney infection include pain in your back or sides below the ribs, nausea, and vomiting. DIAGNOSIS To diagnose a UTI, your caregiver will ask you about your symptoms. Your caregiver will also ask you to provide a urine sample. The urine sample will be tested for bacteria and white blood cells. White blood cells are made by your body to help fight infection. TREATMENT  Typically, UTIs can be treated with medication. Because most UTIs are caused by a bacterial infection, they usually can be  treated with the use of antibiotics. The choice of antibiotic and length of treatment depend on your symptoms and the type of bacteria causing your infection. HOME CARE INSTRUCTIONS  If you were prescribed antibiotics, take them exactly as your caregiver instructs you. Finish the medication even if you feel better after you have only taken some of the medication.  Drink enough water and fluids to keep your urine clear or pale yellow.  Avoid caffeine, tea, and carbonated beverages. They tend to irritate your bladder.  Empty your bladder often. Avoid holding urine for long periods of time.  Empty your bladder before and after sexual intercourse.  After a bowel movement, women should cleanse from front to back. Use each tissue only once. SEEK MEDICAL CARE IF:   You have back pain.  You develop a fever.  Your symptoms do not begin to resolve within 3 days. SEEK IMMEDIATE MEDICAL CARE IF:   You have severe back pain or lower abdominal pain.  You develop chills.  You have nausea or vomiting.  You have continued burning or discomfort with urination. MAKE SURE YOU:   Understand these instructions.  Will watch your condition.  Will get help right away if you are not doing well or get worse.   This information is not intended to replace advice given to you by your health care provider. Make sure you discuss any questions you have with your health care provider.   Document Released: 04/08/2005 Document Revised: 03/20/2015 Document Reviewed: 08/07/2011 Elsevier Interactive Patient Education [employer].  Urine Culture and Sensitivity Testing WHY AM I HAVING THIS TEST?  A urine culture is a test to see if germs grow from your urine sample. Normally, urine is free of germs (sterile). Germs in urine are usually bacteria. Sometimes they can be yeasts. These germs can cause a urinary tract infection (UTI). You may have this test if you have symptoms of a UTI. These may  include:  Frequent urination.  Burning pain when passing urine. If you are pregnant, your health care provider may order this test to screen you for a UTI. When you pass urine, the urine flows through the tube that empties your bladder (urethra). In men, urine comes out through an opening at the tip of the penis. In women, it comes out of the body from just above the vaginal opening. These areas may have bacteria near them that normally live on the skin (normal flora). WHAT KIND OF SAMPLE IS TAKEN? A urine sample for a culture test must be collected in a way that keeps normal flora from getting into the sample. The method used most often is called a clean-catch sample. In a few cases, urine may need to be collected directly from the bladder using a thin, flexible tube (catheter). The health care provider puts the catheter through the person's urethra and into the bladder. Your urine sample will be placed onto plates containing a substance that encourages bacteria to grow (agar plates). These plates are kept at body temperature for 24-48 hours to see if bacteria or other germs grow. Then, a lab technician examines them under a microscope to check for germs. Any germs that grow from the culture will be tested against a variety of medicines to find the one that works best (sensitivity testing). For a UTI caused by bacteria, several types of antibiotic medicines may be tested. HOW DO I PREPARE FOR THE TEST?  Do not urinate for about an hour before collecting the sample.  Drink a glass of water about 20 minutes before collecting the sample.  Tell your health care provider if you have been taking antibiotics. This may affect the results of your test. Your health care provider may give you sterile wipes to clean your vagina or penis to prepare for collecting a clean-catch sample. To collect the sample, you will need to do the following: For Women and Girls  Sit on the toilet and spread the lips of your  vagina.  Use one wipe to clean your vaginal area from front to back.  Use a second wipe to clean the opening of your urethra.  Pass a small amount of urine directly into the toilet while still spreading your vagina.  Then, hold the sterile cup underneath you and urinate into it.  Fill the cup about halfway. Cap it and return it for testing. For Men and Boys  Use the sterile wipe to clean the tip of your penis.  Pass a small amount of urine directly into the toilet first.  Then, urinate into the sterile cup.  Fill the cup about halfway. Cap it and return it for testing. WHAT DO THE RESULTS MEAN? The result of a urine culture and sensitivity test will be positive or negative.   If enough bacteria grow from your urine sample, your test result is considered positive.  If many different bacteria grow from your urine sample, your test may be reported as contaminated.  If no bacteria grow from your sample after 24-48 hours, your test result is considered negative.  Results of sensitivity testing let your health care provider know which medicines to use to treat your infection. If the results of your urine culture are negative, this means:  It is less likely that you have a UTI.  Your test may be repeated if you still have symptoms. If the results of your urine culture are positive, this means:  It is more likely that you have a UTI.  You may need to start treatment based on your sensitivity results. Talk to your health care provider to discuss your results, treatment options, and if necessary, the need for more tests. It is your responsibility to obtain your test results. Ask the lab or department performing the test when and how you will get your results. Talk with your health care provider if you have any questions about your results.   This information is not intended to replace advice given to you by your health care provider. Make sure you discuss any questions you have with  your health care provider.   Document Released: 07/24/2004 Document Revised: 07/20/2014 Document Reviewed: 10/26/2013 Elsevier Interactive Patient Education 2016 [employer].  Meningioma Meningioma is a tumor that occurs in the thin tissue that covers the brain and spinal cord (meninges). Meningiomas are usually benign, which means they are not cancerous and do not spread to other areas. In rare cases, a meningioma may become cancerous (malignant). Older women have a higher risk of having meningiomas. However, men have a higher risk of having a meningioma that is malignant. RISK FACTORS People who have had radiation exposure in the past may have an increased risk of developing this type of tumor. People who have neurofibromatosis 2 may also have an increased risk of meningioma. Older women have a higher risk of meningiomas than men or children. However, men have a higher risk of meningiomas that are malignant. SIGNS AND SYMPTOMS Symptoms of meningioma usually begin very slowly. The symptoms may depend on the size and location of the tumor. Possible symptoms include:   Headaches.  Nausea and vomiting.  Vision changes.  Hearing changes.   Loss of the sense of smell.  Seizures.   Weakness or numbness on one side of the body or in an arm or leg.   Mood changes.   Problems with memory or thinking.  DIAGNOSIS  Brain tumors can usually be seen on brain imaging, such as CT scan or MRI. A sample of the tumor will need to be studied in a laboratory (biopsy) to confirm the diagnosis of meningioma. Information about the tumor cells also helps guide treatment. TREATMENT  Because meningioma is so slow growing, treatment is often delayed until symptoms affect daily activities. Regular monitoring is performed to track the tumor's growth.  There are several ways that meningioma is treated:  Surgery to remove as much of the tumor as possible.  High-energy rays (radiation therapy) to help  shrink or kill the tumor.  Chemotherapy to shrink or kill the tumor. Because normal cells may also be killed, chemotherapy has many side effects.  Targeted therapy, using substances that injure or kill cancer cells without affecting normal cells.  Steroid medicine to decrease brain swelling and improve symptoms. HOME CARE INSTRUCTIONS  Take all medicines as directed by your health care provider.  Go to all follow-up appointments. SEEK MEDICAL CARE IF:  Any symptoms come back.  You have diarrhea, throw up (vomit), or have abdominal pain.  You cannot eat or drink as much as you need.  You are more  weak or tired than usual.   You are losing weight without trying. SEEK IMMEDIATE MEDICAL CARE IF:  Your diarrhea, vomiting, or abdominal pain does not go away.  You have new symptoms, such as vision problems or difficulty walking.   You have a seizure.   You have bleeding that does not stop.   You have trouble breathing.   You have a fever.    This information is not intended to replace advice given to you by your health care provider. Make sure you discuss any questions you have with your health care provider.   Document Released: 07/04/2013 Document Reviewed: 07/04/2013 Elsevier Interactive Patient Education [employer].

## 2015-11-27 NOTE — ED Notes (Signed)
PTAR AT bedside for transport

## 2015-11-27 NOTE — ED Notes (Signed)
Multiple attempts for IV start. Unsuccessful. IV team consult placed.

## 2015-11-29 LAB — URINE CULTURE: Special Requests: NORMAL

## 2015-12-27 ENCOUNTER — Inpatient Hospital Stay (HOSPITAL_COMMUNITY): Payer: Medicare Other

## 2015-12-27 ENCOUNTER — Emergency Department (HOSPITAL_COMMUNITY): Payer: Medicare Other

## 2015-12-27 ENCOUNTER — Encounter (HOSPITAL_COMMUNITY): Payer: Self-pay

## 2015-12-27 ENCOUNTER — Inpatient Hospital Stay (HOSPITAL_COMMUNITY)
Admission: EM | Admit: 2015-12-27 | Discharge: 2016-01-03 | DRG: 871 | Disposition: A | Discharge status: Hospice | Payer: Medicare Other | Attending: Internal Medicine | Admitting: Internal Medicine

## 2015-12-27 DIAGNOSIS — Z79899 Other long term (current) drug therapy: Secondary | ICD-10-CM

## 2015-12-27 DIAGNOSIS — L89154 Pressure ulcer of sacral region, stage 4: Secondary | ICD-10-CM | POA: Diagnosis present

## 2015-12-27 DIAGNOSIS — G9340 Encephalopathy, unspecified: Secondary | ICD-10-CM | POA: Diagnosis not present

## 2015-12-27 DIAGNOSIS — A419 Sepsis, unspecified organism: Principal | ICD-10-CM | POA: Diagnosis present

## 2015-12-27 DIAGNOSIS — F0390 Unspecified dementia without behavioral disturbance: Secondary | ICD-10-CM | POA: Diagnosis not present

## 2015-12-27 DIAGNOSIS — E785 Hyperlipidemia, unspecified: Secondary | ICD-10-CM | POA: Diagnosis present

## 2015-12-27 DIAGNOSIS — Z86011 Personal history of benign neoplasm of the brain: Secondary | ICD-10-CM

## 2015-12-27 DIAGNOSIS — Z87440 Personal history of urinary (tract) infections: Secondary | ICD-10-CM

## 2015-12-27 DIAGNOSIS — Z7189 Other specified counseling: Secondary | ICD-10-CM | POA: Insufficient documentation

## 2015-12-27 DIAGNOSIS — Z7401 Bed confinement status: Secondary | ICD-10-CM

## 2015-12-27 DIAGNOSIS — N390 Urinary tract infection, site not specified: Secondary | ICD-10-CM | POA: Diagnosis present

## 2015-12-27 DIAGNOSIS — E876 Hypokalemia: Secondary | ICD-10-CM | POA: Diagnosis present

## 2015-12-27 DIAGNOSIS — L8990 Pressure ulcer of unspecified site, unspecified stage: Secondary | ICD-10-CM

## 2015-12-27 DIAGNOSIS — Z66 Do not resuscitate: Secondary | ICD-10-CM | POA: Diagnosis present

## 2015-12-27 DIAGNOSIS — E878 Other disorders of electrolyte and fluid balance, not elsewhere classified: Secondary | ICD-10-CM | POA: Diagnosis present

## 2015-12-27 DIAGNOSIS — D649 Anemia, unspecified: Secondary | ICD-10-CM | POA: Diagnosis present

## 2015-12-27 DIAGNOSIS — I248 Other forms of acute ischemic heart disease: Secondary | ICD-10-CM | POA: Diagnosis present

## 2015-12-27 DIAGNOSIS — N179 Acute kidney failure, unspecified: Secondary | ICD-10-CM | POA: Diagnosis present

## 2015-12-27 DIAGNOSIS — H9190 Unspecified hearing loss, unspecified ear: Secondary | ICD-10-CM | POA: Diagnosis present

## 2015-12-27 DIAGNOSIS — Z1612 Extended spectrum beta lactamase (ESBL) resistance: Secondary | ICD-10-CM | POA: Diagnosis present

## 2015-12-27 DIAGNOSIS — L089 Local infection of the skin and subcutaneous tissue, unspecified: Secondary | ICD-10-CM | POA: Diagnosis present

## 2015-12-27 DIAGNOSIS — Z79891 Long term (current) use of opiate analgesic: Secondary | ICD-10-CM

## 2015-12-27 DIAGNOSIS — D329 Benign neoplasm of meninges, unspecified: Secondary | ICD-10-CM | POA: Diagnosis present

## 2015-12-27 DIAGNOSIS — I10 Essential (primary) hypertension: Secondary | ICD-10-CM | POA: Diagnosis present

## 2015-12-27 DIAGNOSIS — R012 Other cardiac sounds: Secondary | ICD-10-CM | POA: Diagnosis not present

## 2015-12-27 DIAGNOSIS — I9589 Other hypotension: Secondary | ICD-10-CM | POA: Diagnosis not present

## 2015-12-27 DIAGNOSIS — E870 Hyperosmolality and hypernatremia: Secondary | ICD-10-CM | POA: Diagnosis present

## 2015-12-27 DIAGNOSIS — E860 Dehydration: Secondary | ICD-10-CM | POA: Diagnosis present

## 2015-12-27 DIAGNOSIS — Z9181 History of falling: Secondary | ICD-10-CM

## 2015-12-27 DIAGNOSIS — I959 Hypotension, unspecified: Secondary | ICD-10-CM

## 2015-12-27 DIAGNOSIS — L8995 Pressure ulcer of unspecified site, unstageable: Secondary | ICD-10-CM | POA: Diagnosis not present

## 2015-12-27 DIAGNOSIS — R778 Other specified abnormalities of plasma proteins: Secondary | ICD-10-CM | POA: Diagnosis present

## 2015-12-27 DIAGNOSIS — Z515 Encounter for palliative care: Secondary | ICD-10-CM | POA: Insufficient documentation

## 2015-12-27 DIAGNOSIS — R5381 Other malaise: Secondary | ICD-10-CM | POA: Diagnosis present

## 2015-12-27 DIAGNOSIS — E875 Hyperkalemia: Secondary | ICD-10-CM | POA: Diagnosis present

## 2015-12-27 DIAGNOSIS — N19 Unspecified kidney failure: Secondary | ICD-10-CM

## 2015-12-27 DIAGNOSIS — E871 Hypo-osmolality and hyponatremia: Secondary | ICD-10-CM | POA: Diagnosis present

## 2015-12-27 DIAGNOSIS — R7989 Other specified abnormal findings of blood chemistry: Secondary | ICD-10-CM | POA: Diagnosis present

## 2015-12-27 DIAGNOSIS — Z8249 Family history of ischemic heart disease and other diseases of the circulatory system: Secondary | ICD-10-CM

## 2015-12-27 DIAGNOSIS — G9341 Metabolic encephalopathy: Secondary | ICD-10-CM | POA: Diagnosis present

## 2015-12-27 DIAGNOSIS — E46 Unspecified protein-calorie malnutrition: Secondary | ICD-10-CM | POA: Diagnosis present

## 2015-12-27 DIAGNOSIS — E43 Unspecified severe protein-calorie malnutrition: Secondary | ICD-10-CM | POA: Diagnosis present

## 2015-12-27 DIAGNOSIS — R4182 Altered mental status, unspecified: Secondary | ICD-10-CM | POA: Diagnosis present

## 2015-12-27 HISTORY — DX: Unspecified dementia, unspecified severity, without behavioral disturbance, psychotic disturbance, mood disturbance, and anxiety: F03.90

## 2015-12-27 LAB — COMPREHENSIVE METABOLIC PANEL
ALBUMIN: 2.2 g/dL — AB (ref 3.5–5.0)
ALK PHOS: 128 U/L — AB (ref 38–126)
ALT: 17 U/L (ref 14–54)
AST: 53 U/L — AB (ref 15–41)
Anion gap: 14 (ref 5–15)
BUN: 95 mg/dL — ABNORMAL HIGH (ref 6–20)
CALCIUM: 8.8 mg/dL — AB (ref 8.9–10.3)
CHLORIDE: 116 mmol/L — AB (ref 101–111)
CO2: 19 mmol/L — AB (ref 22–32)
CREATININE: 2.35 mg/dL — AB (ref 0.44–1.00)
GFR calc non Af Amer: 17 mL/min — ABNORMAL LOW (ref >60)
GFR, EST AFRICAN AMERICAN: 20 mL/min — AB (ref >60)
GLUCOSE: 84 mg/dL (ref 65–99)
Potassium: 3.6 mmol/L (ref 3.5–5.1)
SODIUM: 149 mmol/L — AB (ref 135–145)
Total Bilirubin: 0.8 mg/dL (ref 0.3–1.2)
Total Protein: 6.9 g/dL (ref 6.5–8.1)

## 2015-12-27 LAB — URINE MICROSCOPIC-ADD ON

## 2015-12-27 LAB — I-STAT CG4 LACTIC ACID, ED: Lactic Acid, Venous: 1.81 mmol/L (ref 0.5–2.0)

## 2015-12-27 LAB — CBC
HCT: 33.5 % — ABNORMAL LOW (ref 36.0–46.0)
HEMOGLOBIN: 10.1 g/dL — AB (ref 12.0–15.0)
MCH: 30.2 pg (ref 26.0–34.0)
MCHC: 30.1 g/dL (ref 30.0–36.0)
MCV: 100.3 fL — AB (ref 78.0–100.0)
PLATELETS: 355 10*3/uL (ref 150–400)
RBC: 3.34 MIL/uL — AB (ref 3.87–5.11)
RDW: 17.6 % — ABNORMAL HIGH (ref 11.5–15.5)
WBC: 27.6 10*3/uL — ABNORMAL HIGH (ref 4.0–10.5)

## 2015-12-27 LAB — URINALYSIS, ROUTINE W REFLEX MICROSCOPIC
GLUCOSE, UA: NEGATIVE mg/dL
Hgb urine dipstick: NEGATIVE
KETONES UR: NEGATIVE mg/dL
Nitrite: NEGATIVE
PH: 8.5 — AB (ref 5.0–8.0)
Protein, ur: 100 mg/dL — AB
SPECIFIC GRAVITY, URINE: 1.027 (ref 1.005–1.030)

## 2015-12-27 LAB — CBG MONITORING, ED: GLUCOSE-CAPILLARY: 75 mg/dL (ref 65–99)

## 2015-12-27 LAB — TROPONIN I: TROPONIN I: 0.06 ng/mL — AB (ref <0.031)

## 2015-12-27 LAB — MRSA PCR SCREENING: MRSA BY PCR: POSITIVE — AB

## 2015-12-27 MED ORDER — CEFTRIAXONE SODIUM 1 G IJ SOLR
1.0000 g | INTRAMUSCULAR | Status: DC
  Administered 2015-12-27 – 2015-12-29 (×3): 1 g via INTRAVENOUS
  Filled 2015-12-27 (×3): qty 10

## 2015-12-27 MED ORDER — HEPARIN SODIUM (PORCINE) 5000 UNIT/ML IJ SOLN
5000.0000 [IU] | Freq: Three times a day (TID) | INTRAMUSCULAR | Status: DC
  Administered 2015-12-27 – 2016-01-01 (×14): 5000 [IU] via SUBCUTANEOUS
  Filled 2015-12-27 (×14): qty 1

## 2015-12-27 MED ORDER — SODIUM CHLORIDE 0.9 % IV SOLN
INTRAVENOUS | Status: AC
Start: 2015-12-27 — End: 2015-12-28

## 2015-12-27 MED ORDER — SODIUM CHLORIDE 0.9 % IV SOLN
INTRAVENOUS | Status: DC
  Administered 2015-12-27 – 2015-12-28 (×2): via INTRAVENOUS

## 2015-12-27 MED ORDER — SODIUM CHLORIDE 0.9 % IV BOLUS (SEPSIS): 250.0000 mL | Freq: Once | INTRAVENOUS | Status: DC

## 2015-12-27 MED ORDER — SODIUM CHLORIDE 0.9 % IV BOLUS (SEPSIS)
1000.0000 mL | Freq: Once | INTRAVENOUS | Status: AC
  Administered 2015-12-27: 1000 mL via INTRAVENOUS

## 2015-12-27 MED ORDER — ACETAMINOPHEN 650 MG RE SUPP
650.0000 mg | Freq: Four times a day (QID) | RECTAL | Status: DC | PRN
Start: 2015-12-27 — End: 2016-01-03

## 2015-12-27 MED ORDER — SODIUM CHLORIDE 0.9% FLUSH
3.0000 mL | Freq: Two times a day (BID) | INTRAVENOUS | Status: DC
  Administered 2015-12-27 – 2016-01-02 (×5): 3 mL via INTRAVENOUS

## 2015-12-27 MED ORDER — VANCOMYCIN HCL IN DEXTROSE 1-5 GM/200ML-% IV SOLN
1000.0000 mg | Freq: Once | INTRAVENOUS | Status: AC
  Administered 2015-12-27: 1000 mg via INTRAVENOUS
  Filled 2015-12-27: qty 200

## 2015-12-27 MED ORDER — PIPERACILLIN-TAZOBACTAM 3.375 G IVPB 30 MIN
3.3750 g | Freq: Once | INTRAVENOUS | Status: AC
  Administered 2015-12-27: 3.375 g via INTRAVENOUS
  Filled 2015-12-27: qty 50

## 2015-12-27 MED ORDER — ACETAMINOPHEN 325 MG PO TABS
650.0000 mg | ORAL_TABLET | Freq: Four times a day (QID) | ORAL | Status: DC | PRN
  Filled 2015-12-27: qty 2

## 2015-12-27 MED ORDER — MUPIROCIN 2 % EX OINT
1.0000 "application " | TOPICAL_OINTMENT | Freq: Two times a day (BID) | CUTANEOUS | Status: AC
  Administered 2015-12-28 – 2016-01-01 (×10): 1 via NASAL
  Filled 2015-12-27 (×2): qty 22

## 2015-12-27 MED ORDER — CHLORHEXIDINE GLUCONATE CLOTH 2 % EX PADS
6.0000 | MEDICATED_PAD | Freq: Every day | CUTANEOUS | Status: AC
  Administered 2015-12-28 – 2016-01-01 (×5): 6 via TOPICAL

## 2015-12-27 NOTE — H&P (Addendum)
TRH H&P   Patient Demographics:    Sandra Flowers, is a 82 y.o. female  MRN: [id number]   DOB - [birth year]  Admit Date - 12/27/2015  Outpatient Primary MD for the patient is Dasanayaka, Edgar Frisk, MD  Referring MD/NP/PA:  Aundria Rud  Outpatient Specialists:  Dr. Arlan Organ at [facility] (neurologist)  Patient coming from: [facility]  Chief Complaint  Patient presents with  . Altered Mental Status      HPI:    Sandra Flowers  is a 82 y.o. female, w/ meningioma, dementia, sacral decubitus ulcer who apparently has had decrease in responsiveness over the past several days according to her daughters.  Pt has had decrease in po intake.  Pt was brought to ED for evaluation and found to have UTI, Hypotension and ARF.  Pt will be admitted for hypotension.      Review of systems:    In addition to the HPI above, No Fever-chills, No Headache, No changes with Vision or hearing, No problems swallowing food or Liquids, No Chest pain, Cough or Shortness of Breath, No Abdominal pain, No Nausea or Vommitting, Bowel movements are regular, No Blood in stool or Urine, No dysuria, No new skin rashes or bruises, No new joints pains-aches,  No new weakness, tingling, numbness in any extremity, No recent weight gain or loss, No polyuria, polydypsia or polyphagia, No significant Mental Stressors.  A full 10 point Review of Systems was done, except as stated above, all other Review of Systems were negative.   With Past History of the following :    Past Medical History  Diagnosis Date  . Hypertension   . Hyperlipidemia   . Meningioma ([place]) y-10  . Arthritis   . Hearing loss m-6  . Dementia       Past Surgical History  Procedure Laterality Date  . Cholecystectomy    . Abdominal hysterectomy    . Debridment of decubitus ulcer N/A 09/27/2015    Procedure:  DEBRIDMENT OF DECUBITUS ULCER;  Surgeon: Leighton Ruff, MD;  Location: [facility] ORS;  Service: General;  Laterality: N/A;      Social History:     Social History  Substance Use Topics  . Smoking status: Never Smoker   . Smokeless tobacco: Never Used  . Alcohol Use: No     Lives - at [place] - nonambulatory    Family History :     Family History  Problem Relation Age of Onset  . Heart disease Mother   . Hypertension Son   . Cancer Maternal Aunt     lung  . Cancer Paternal Aunt     liver  . Heart disease Father       Home Medications:   Prior to Admission medications   Medication Sig Start Date End Date Taking? Authorizing Provider  Amino Acids-Protein Hydrolys (FEEDING SUPPLEMENT, PRO-STAT SUGAR  FREE 64,) LIQD Take 30 mLs by mouth 3 (three) times daily. 10/01/15  Yes Robbie Lis, MD  amoxicillin-clavulanate (AUGMENTIN) 875-125 MG tablet Take 1 tablet by mouth 2 (two) times daily. Starting 12/27/15 for 7 days.   Yes Historical Provider, MD  cetirizine (ZYRTEC) 10 MG tablet Take 10 mg by mouth daily.   Yes Historical Provider, MD  collagenase (SANTYL) ointment Apply 1 application topically every evening. To left calf and R ischium.   Yes Historical Provider, MD  dipyridamole-aspirin (AGGRENOX) 200-25 MG 12hr capsule Take 1 capsule by mouth 2 (two) times daily.   Yes Historical Provider, MD  ferrous sulfate 325 (65 FE) MG tablet Take 1 tablet (325 mg total) by mouth daily with breakfast. 10/01/15  Yes Robbie Lis, MD  Menthol-Methyl Salicylate (BENGAY GREASELESS EX) Apply 11 application topically 2 (two) times daily. To R hip.   Yes Historical Provider, MD  mirtazapine (REMERON) 7.5 MG tablet Take 7.5 mg by mouth at bedtime.   Yes Historical Provider, MD  Multiple Vitamin (MULTIVITAMIN WITH MINERALS) TABS tablet Take 1 tablet by mouth daily.   Yes Historical Provider, MD  oxyCODONE (OXYCONTIN) 10 mg 12 hr tablet Take 10 mg by mouth every 12 (twelve) hours.    Yes Historical Provider, MD  oxyCODONE-acetaminophen (PERCOCET/ROXICET) 5-325 MG tablet Take 1-2 tablets by mouth every 6 (six) hours as needed for moderate pain or severe pain. 10/03/15  Yes Modena Jansky, MD  Polyethyl Glycol-Propyl Glycol (SYSTANE) 0.4-0.3 % GEL ophthalmic gel Place 1 application into both eyes daily as needed (dry eye syndrome.).    Yes Historical Provider, MD  polyvinyl alcohol (LIQUIFILM TEARS) 1.4 % ophthalmic solution Place 1 drop into both eyes as needed for dry eyes.   Yes Historical Provider, MD  saccharomyces boulardii (FLORASTOR) 250 MG capsule Take 250 mg by mouth 2 (two) times daily.   Yes Historical Provider, MD  senna-docusate (SENOKOT-S) 8.6-50 MG tablet Take 2 tablets by mouth at bedtime.   Yes Historical Provider, MD  vitamin C (ASCORBIC ACID) 500 MG tablet Take 500 mg by mouth daily.   Yes Historical Provider, MD     Allergies:    No Known Allergies   Physical Exam:   Vitals  Blood pressure 87/67, pulse 99, temperature 97.1 F (36.2 C), temperature source Rectal, resp. rate 20, SpO2 90 %.   1. General  lying in bed in NAD,    2. Normal affect and insight, Not Suicidal or Homicidal, Awake Alert, Oriented X 1 (person)  3. No F.N deficits, ALL C.Nerves Intact, Strength 5/5 all 4 extremities, Sensation intact all 4 extremities, Plantars down going.  4. Ears and Eyes appear Normal, Conjunctivae clear, PERRLA. Mucous membranes dry  5. Supple Neck, No JVD, No cervical lymphadenopathy appriciated, No Carotid Bruits.  6. Symmetrical Chest wall movement, Good air movement bilaterally, CTAB.  7. RRR, No Gallops, Rubs or Murmurs, No Parasternal Heave.  8. Positive Bowel Sounds, Abdomen Soft, No tenderness, No organomegaly appriciated,No rebound -guarding or rigidity.  9.  No Cyanosis, Normal Skin Turgor, No Skin Rash or Bruise., unable to turn patient to look at [facility].   10. Good muscle tone,  joints appear normal , no effusions, Normal ROM.  11.  No Palpable Lymph Nodes in Neck or Axillae     Data Review:    CBC  Recent Labs Lab 12/27/15 1432  WBC 27.6*  HGB 10.1*  HCT 33.5*  PLT 355  MCV 100.3*  MCH 30.2  MCHC 30.1  RDW 17.6*   ------------------------------------------------------------------------------------------------------------------  Chemistries   Recent Labs Lab 12/27/15 1432  NA 149*  K 3.6  CL 116*  CO2 19*  GLUCOSE 84  BUN 95*  CREATININE 2.35*  CALCIUM 8.8*  AST 53*  ALT 17  ALKPHOS 128*  BILITOT 0.8   ------------------------------------------------------------------------------------------------------------------ CrCl cannot be calculated (Unknown ideal weight.). ------------------------------------------------------------------------------------------------------------------ No results for input(s): TSH, T4TOTAL, T3FREE, THYROIDAB in the last 72 hours.  Invalid input(s): FREET3  Coagulation profile No results for input(s): INR, PROTIME in the last 168 hours. ------------------------------------------------------------------------------------------------------------------- No results for input(s): DDIMER in the last 72 hours. -------------------------------------------------------------------------------------------------------------------  Cardiac Enzymes No results for input(s): CKMB, TROPONINI, MYOGLOBIN in the last 168 hours.  Invalid input(s): CK ------------------------------------------------------------------------------------------------------------------ No results found for: BNP   ---------------------------------------------------------------------------------------------------------------  Urinalysis    Component Value Date/Time   COLORURINE RED* 12/27/2015 1752   APPEARANCEUR TURBID* 12/27/2015 1752   LABSPEC 1.027 12/27/2015 1752   PHURINE 8.5* 12/27/2015 1752   GLUCOSEU NEGATIVE 12/27/2015 1752   HGBUR NEGATIVE 12/27/2015 1752   BILIRUBINUR SMALL*  12/27/2015 1752   KETONESUR NEGATIVE 12/27/2015 1752   PROTEINUR 100* 12/27/2015 1752   NITRITE NEGATIVE 12/27/2015 1752   LEUKOCYTESUR LARGE* 12/27/2015 1752    ----------------------------------------------------------------------------------------------------------------   Imaging Results:    Dg Chest Port 1 View  12/27/2015  CLINICAL DATA:  Shortness of breath. EXAM: PORTABLE CHEST 1 VIEW COMPARISON:  None. FINDINGS: The heart size and mediastinal contours are within normal limits. Both lungs are clear. No pneumothorax or pleural effusion is noted. Narrowing of the subacromial space is noted bilaterally suggesting rotator cuff injury. IMPRESSION: No acute cardiopulmonary abnormality seen. Electronically Signed   By: Marijo Conception, M.D.   On: 12/27/2015 18:19      Assessment & Plan:    Principal Problem:   Hypotension Active Problems:   Decubitus ulcer, infected   Dehydration   UTI (lower urinary tract infection)   Acute renal failure (ARF) (HCC)   Dementia    1. Hypotension Check trop i q6h x3,  Check cortisol level Hydrate with ns iv  2. ARF Check urine sodium, urine creatinine , urine eosinophils Check renal ultrasound Hydrate with ns iv  3. Leukocytosis Secondary to uti.  tx uti  4. Uti tx with rocephin 1gm iv qday  5. Anemia Check cbc in am Check spep, upep  6. Dementia Observe.   7 Decubitus ulcer Wound care rn  DVT Prophylaxis Heparin - - SCDs   AM Labs Ordered, also please review Full Orders  Family Communication: Admission, patients condition and plan of care including tests being ordered have been discussed with the patient  who indicate understanding and agree with the plan and Code Status.  Code Status DNR  Likely DC to    Condition GUARDED    Consults called: none  Admission status:  inpatient  Time spent in minutes : 45 minutes   Jani Gravel M.D on 12/27/2015 at 7:07 PM  Between 7am to 7pm - Pager - [phone]. After 7pm  go to [email or web address] - password [facility]  [facility] - Office  [phone]

## 2015-12-27 NOTE — ED Notes (Signed)
RN starting IV, drawing labs 

## 2015-12-27 NOTE — ED Notes (Signed)
BIB EMS from [facility], staff states pt has had a decrease in oral intake and is only responsive to painful stimuli. Staff states pt was A+OX4 approximately x2 days ago. Pt presents with mottling to fingers and toes bilaterally. Pt is alert and responsive to painful stimuli.

## 2015-12-27 NOTE — ED Notes (Signed)
Bed: [id number] Expected date:  Expected time:  Means of arrival:  Comments: Failure to thrive

## 2015-12-27 NOTE — ED Provider Notes (Addendum)
CSN: [id number]     Arrival date & time 12/27/15  1340 History   First MD Initiated Contact with Patient 12/27/15 1348     Chief Complaint  Patient presents with  . Altered Mental Status     (Consider location/radiation/quality/duration/timing/severity/associated sxs/prior Treatment) Patient is a 82 y.o. Flowers presenting with altered mental status. The history is provided by a relative, the nursing home and the EMS personnel. The history is limited by the condition of the patient.  Altered Mental Status Patient with a history of some dementia patient is a DO NOT RESUSCITATE patient. Patient with decreased oral intake and decreased responsiveness the past few days. Patient also has a decubitus at a wound VAC in place. That was removed today to bring patient here and has noted that there may be secondary infection.  Past Medical History  Diagnosis Date  . Hypertension   . Hyperlipidemia   . Meningioma ([place]) y-10  . Arthritis   . Hearing loss m-6   Past Surgical History  Procedure Laterality Date  . Cholecystectomy    . Abdominal hysterectomy    . Debridment of decubitus ulcer N/A 09/27/2015    Procedure: DEBRIDMENT OF DECUBITUS ULCER;  Surgeon: Leighton Ruff, MD;  Location: [facility];  Service: General;  Laterality: N/A;   Family History  Problem Relation Age of Onset  . Heart disease Mother   . Hypertension Son   . Cancer Maternal Aunt     lung  . Cancer Paternal Aunt     liver   Social History  Substance Use Topics  . Smoking status: Never Smoker   . Smokeless tobacco: Never Used  . Alcohol Use: No   OB History    No data available     Review of Systems  Unable to perform ROS: Dementia      Allergies  Review of patient's allergies indicates no known allergies.  Home Medications   Prior to Admission medications   Medication Sig Start Date End Date Taking? Authorizing Provider  Amino Acids-Protein Hydrolys (FEEDING SUPPLEMENT, PRO-STAT SUGAR FREE 64,) LIQD Take  30 mLs by mouth 3 (three) times daily. 10/01/15  Yes Robbie Lis, MD  amoxicillin-clavulanate (AUGMENTIN) 875-125 MG tablet Take 1 tablet by mouth 2 (two) times daily. Starting 12/27/15 for 7 days.   Yes Historical Provider, MD  cetirizine (ZYRTEC) 10 MG tablet Take 10 mg by mouth daily.   Yes Historical Provider, MD  collagenase (SANTYL) ointment Apply 1 application topically every evening. To left calf and R ischium.   Yes Historical Provider, MD  dipyridamole-aspirin (AGGRENOX) 200-25 MG 12hr capsule Take 1 capsule by mouth 2 (two) times daily.   Yes Historical Provider, MD  ferrous sulfate 325 (65 FE) MG tablet Take 1 tablet (325 mg total) by mouth daily with breakfast. 10/01/15  Yes Robbie Lis, MD  Menthol-Methyl Salicylate (BENGAY GREASELESS EX) Apply 11 application topically 2 (two) times daily. To R hip.   Yes Historical Provider, MD  mirtazapine (REMERON) 7.5 MG tablet Take 7.5 mg by mouth at bedtime.   Yes Historical Provider, MD  Multiple Vitamin (MULTIVITAMIN WITH MINERALS) TABS tablet Take 1 tablet by mouth daily.   Yes Historical Provider, MD  oxyCODONE (OXYCONTIN) 10 mg Sandra hr tablet Take 10 mg by mouth every Sandra (twelve) hours.   Yes Historical Provider, MD  oxyCODONE-acetaminophen (PERCOCET/ROXICET) 5-325 MG tablet Take 1-2 tablets by mouth every 6 (six) hours as needed for moderate pain or severe pain. 10/03/15  Yes Everlene Farrier D  Hongalgi, MD  Polyethyl Glycol-Propyl Glycol (SYSTANE) 0.4-0.3 % GEL ophthalmic gel Place 1 application into both eyes daily as needed (dry eye syndrome.).    Yes Historical Provider, MD  polyvinyl alcohol (LIQUIFILM TEARS) 1.4 % ophthalmic solution Place 1 drop into both eyes as needed for dry eyes.   Yes Historical Provider, MD  saccharomyces boulardii (FLORASTOR) 250 MG capsule Take 250 mg by mouth 2 (two) times daily.   Yes Historical Provider, MD  senna-docusate (SENOKOT-S) 8.6-50 MG tablet Take 2 tablets by mouth at bedtime.   Yes Historical Provider, MD   vitamin C (ASCORBIC ACID) 500 MG tablet Take 500 mg by mouth daily.   Yes Historical Provider, MD   BP 87/67 mmHg  Pulse 99  Temp(Src) 97.1 F (36.2 C) (Rectal)  Resp 20  SpO2 90% Physical Exam  Constitutional: She appears well-developed. No distress.  HENT:  Head: Normocephalic and atraumatic.  Mucous membranes very dry.  Eyes: Conjunctivae and EOM are normal. Pupils are equal, round, and reactive to light.  Neck: Normal range of motion.  Cardiovascular:  Tachycardic  Pulmonary/Chest: Effort normal and breath sounds normal.  Abdominal: Soft. Bowel sounds are normal. There is no tenderness.  Musculoskeletal: She exhibits no edema.  Posterior decubiti  Neurological: She is alert.  Not able to follow commands.  Skin: No rash noted.  Nursing note and vitals reviewed.   ED Course  Procedures (including critical care time) Labs Review Labs Reviewed  COMPREHENSIVE METABOLIC PANEL - Abnormal; Notable for the following:    Sodium 149 (*)    Chloride 116 (*)    CO2 19 (*)    BUN 95 (*)    Creatinine, Ser 2.35 (*)    Calcium 8.8 (*)    Albumin 2.2 (*)    AST 53 (*)    Alkaline Phosphatase 128 (*)    GFR calc non Af Amer 17 (*)    GFR calc Af Amer 20 (*)    All other components within normal limits  CBC - Abnormal; Notable for the following:    WBC 27.6 (*)    RBC 3.34 (*)    Hemoglobin 10.1 (*)    HCT 33.5 (*)    MCV 100.3 (*)    RDW 17.6 (*)    All other components within normal limits  CULTURE, BLOOD (ROUTINE X 2)  CULTURE, BLOOD (ROUTINE X 2)  URINE CULTURE  URINALYSIS, ROUTINE W REFLEX MICROSCOPIC (NOT AT [facility])  CBG MONITORING, ED  I-STAT CG4 LACTIC ACID, ED   Results for orders placed or performed during the hospital encounter of 12/27/15  Comprehensive metabolic panel  Result Value Ref Range   Sodium 149 (H) 135 - 145 mmol/L   Potassium 3.6 3.5 - 5.1 mmol/L   Chloride 116 (H) 101 - 111 mmol/L   CO2 19 (L) 22 - 32 mmol/L   Glucose, Bld 84 65 - 99 mg/dL    BUN 95 (H) 6 - 20 mg/dL   Creatinine, Ser 2.35 (H) 0.44 - 1.00 mg/dL   Calcium 8.8 (L) 8.9 - 10.3 mg/dL   Total Protein 6.9 6.5 - 8.1 g/dL   Albumin 2.2 (L) 3.5 - 5.0 g/dL   AST 53 (H) 15 - 41 U/L   ALT 17 14 - 54 U/L   Alkaline Phosphatase 128 (H) 38 - 126 U/L   Total Bilirubin 0.8 0.3 - 1.2 mg/dL   GFR calc non Af Amer 17 (L) >60 mL/min   GFR calc Af Amer 20 (L) >  60 mL/min   Anion gap 14 5 - 15  CBC  Result Value Ref Range   WBC 27.6 (H) 4.0 - 10.5 K/uL   RBC 3.34 (L) 3.87 - 5.11 MIL/uL   Hemoglobin 10.1 (L) Sandra.0 - 15.0 g/dL   HCT 33.5 (L) 36.0 - 46.0 %   MCV 100.3 (H) 78.0 - 100.0 fL   MCH 30.2 26.0 - 34.0 pg   MCHC 30.1 30.0 - 36.0 g/dL   RDW 17.6 (H) 11.5 - 15.5 %   Platelets 355 150 - 400 K/uL  CBG monitoring, ED  Result Value Ref Range   Glucose-Capillary 75 65 - 99 mg/dL   Results for orders placed or performed during the hospital encounter of 12/27/15  Comprehensive metabolic panel  Result Value Ref Range   Sodium 149 (H) 135 - 145 mmol/L   Potassium 3.6 3.5 - 5.1 mmol/L   Chloride 116 (H) 101 - 111 mmol/L   CO2 19 (L) 22 - 32 mmol/L   Glucose, Bld 84 65 - 99 mg/dL   BUN 95 (H) 6 - 20 mg/dL   Creatinine, Ser 2.35 (H) 0.44 - 1.00 mg/dL   Calcium 8.8 (L) 8.9 - 10.3 mg/dL   Total Protein 6.9 6.5 - 8.1 g/dL   Albumin 2.2 (L) 3.5 - 5.0 g/dL   AST 53 (H) 15 - 41 U/L   ALT 17 14 - 54 U/L   Alkaline Phosphatase 128 (H) 38 - 126 U/L   Total Bilirubin 0.8 0.3 - 1.2 mg/dL   GFR calc non Af Amer 17 (L) >60 mL/min   GFR calc Af Amer 20 (L) >60 mL/min   Anion gap 14 5 - 15  CBC  Result Value Ref Range   WBC 27.6 (H) 4.0 - 10.5 K/uL   RBC 3.34 (L) 3.87 - 5.11 MIL/uL   Hemoglobin 10.1 (L) Sandra.0 - 15.0 g/dL   HCT 33.5 (L) 36.0 - 46.0 %   MCV 100.3 (H) 78.0 - 100.0 fL   MCH 30.2 26.0 - 34.0 pg   MCHC 30.1 30.0 - 36.0 g/dL   RDW 17.6 (H) 11.5 - 15.5 %   Platelets 355 150 - 400 K/uL  Urinalysis, Routine w reflex microscopic (not at [facility])  Result Value Ref Range    Color, Urine RED (A) YELLOW   APPearance TURBID (A) CLEAR   Specific Gravity, Urine 1.027 1.005 - 1.030   pH 8.5 (H) 5.0 - 8.0   Glucose, UA NEGATIVE NEGATIVE mg/dL   Hgb urine dipstick NEGATIVE NEGATIVE   Bilirubin Urine SMALL (A) NEGATIVE   Ketones, ur NEGATIVE NEGATIVE mg/dL   Protein, ur 100 (A) NEGATIVE mg/dL   Nitrite NEGATIVE NEGATIVE   Leukocytes, UA LARGE (A) NEGATIVE  Urine microscopic-add on  Result Value Ref Range   Squamous Epithelial / LPF 6-30 (A) NONE SEEN   WBC, UA TOO NUMEROUS TO COUNT 0 - 5 WBC/hpf   RBC / HPF 0-5 0 - 5 RBC/hpf   Bacteria, UA MANY (A) NONE SEEN   Crystals TRIPLE PHOSPHATE CRYSTALS (A) NEGATIVE  CBG monitoring, ED  Result Value Ref Range   Glucose-Capillary 75 65 - 99 mg/dL   Results for orders placed or performed during the hospital encounter of 12/27/15  Comprehensive metabolic panel  Result Value Ref Range   Sodium 149 (H) 135 - 145 mmol/L   Potassium 3.6 3.5 - 5.1 mmol/L   Chloride 116 (H) 101 - 111 mmol/L   CO2 19 (L) 22 - 32 mmol/L  Glucose, Bld 84 65 - 99 mg/dL   BUN 95 (H) 6 - 20 mg/dL   Creatinine, Ser 2.35 (H) 0.44 - 1.00 mg/dL   Calcium 8.8 (L) 8.9 - 10.3 mg/dL   Total Protein 6.9 6.5 - 8.1 g/dL   Albumin 2.2 (L) 3.5 - 5.0 g/dL   AST 53 (H) 15 - 41 U/L   ALT 17 14 - 54 U/L   Alkaline Phosphatase 128 (H) 38 - 126 U/L   Total Bilirubin 0.8 0.3 - 1.2 mg/dL   GFR calc non Af Amer 17 (L) >60 mL/min   GFR calc Af Amer 20 (L) >60 mL/min   Anion gap 14 5 - 15  CBC  Result Value Ref Range   WBC 27.6 (H) 4.0 - 10.5 K/uL   RBC 3.34 (L) 3.87 - 5.11 MIL/uL   Hemoglobin 10.1 (L) Sandra.0 - 15.0 g/dL   HCT 33.5 (L) 36.0 - 46.0 %   MCV 100.3 (H) 78.0 - 100.0 fL   MCH 30.2 26.0 - 34.0 pg   MCHC 30.1 30.0 - 36.0 g/dL   RDW 17.6 (H) 11.5 - 15.5 %   Platelets 355 150 - 400 K/uL  Urinalysis, Routine w reflex microscopic (not at [facility])  Result Value Ref Range   Color, Urine RED (A) YELLOW   APPearance TURBID (A) CLEAR   Specific Gravity,  Urine 1.027 1.005 - 1.030   pH 8.5 (H) 5.0 - 8.0   Glucose, UA NEGATIVE NEGATIVE mg/dL   Hgb urine dipstick NEGATIVE NEGATIVE   Bilirubin Urine SMALL (A) NEGATIVE   Ketones, ur NEGATIVE NEGATIVE mg/dL   Protein, ur 100 (A) NEGATIVE mg/dL   Nitrite NEGATIVE NEGATIVE   Leukocytes, UA LARGE (A) NEGATIVE  Urine microscopic-add on  Result Value Ref Range   Squamous Epithelial / LPF 6-30 (A) NONE SEEN   WBC, UA TOO NUMEROUS TO COUNT 0 - 5 WBC/hpf   RBC / HPF 0-5 0 - 5 RBC/hpf   Bacteria, UA MANY (A) NONE SEEN   Crystals TRIPLE PHOSPHATE CRYSTALS (A) NEGATIVE  CBG monitoring, ED  Result Value Ref Range   Glucose-Capillary 75 65 - 99 mg/dL   Dg Chest Port 1 View  12/27/2015  CLINICAL DATA:  Shortness of breath. EXAM: PORTABLE CHEST 1 VIEW COMPARISON:  None. FINDINGS: The heart size and mediastinal contours are within normal limits. Both lungs are clear. No pneumothorax or pleural effusion is noted. Narrowing of the subacromial space is noted bilaterally suggesting rotator cuff injury. IMPRESSION: No acute cardiopulmonary abnormality seen. Electronically Signed   By: Marijo Conception, M.D.   On: 12/27/2015 18:19     Imaging Review No results found. I have personally reviewed and evaluated these images and lab results as part of my medical decision-making.   EKG Interpretation   Date/Time:  Friday December 27 2015 14:14:55 EDT Ventricular Rate:  107 PR Interval:  104 QRS Duration: 94 QT Interval:  347 QTC Calculation: 463 R Axis:   -36 Text Interpretation:  Sinus tachycardia Multiform ventricular premature  complexes Right atrial enlargement Inferior infarct, old Lateral leads are  also involved Artifact in lead(s) I II III aVR aVL No previous ECGs  available Confirmed by Floy Riegler  MD, Kynsli Haapala [phone]) on 12/27/2015 5:26:25  PM      CRITICAL CARE Performed by: Fredia Sorrow Total critical care time: 30 minutes Critical care time was exclusive of separately billable procedures and  treating other patients. Critical care was necessary to treat or prevent imminent or life-threatening  deterioration. Critical care was time spent personally by me on the following activities: development of treatment plan with patient and/or surrogate as well as nursing, discussions with consultants, evaluation of patient's response to treatment, examination of patient, obtaining history from patient or surrogate, ordering and performing treatments and interventions, ordering and review of laboratory studies, ordering and review of radiographic studies, pulse oximetry and re-evaluation of patient's condition.    MDM   Final diagnoses:  Sepsis, due to unspecified organism [facility])  Renal failure    Patient with workup still in process. Patient is a DO NOT RESUSCITATE. Patient sent in for decreased oral intake and decreased responsiveness. Patient's had the dementia for several weeks now. Patient workup here shows evidence of hypotension. Blood pressure lowest was 85 systolic. Patient without fever but temp was low at 97. Patient started on sepsis protocol. Patient will receive IV fluids started on broad-spectrum antibiotics. Chest x-ray pending urinalysis pending. Patient's had trouble with urinary tract infections in the past. Patient will require admission due to the hypotension. Lactic acid is still pending. Patient's responsiveness to fluids is not known yet.  Patient was significant elevated BUN and creatinine which appears to be a prerenal renal failure picture. Patient's CO2 is low potassium is not significantly abnormal. Patient with marked leukocytosis. EKG did show evidence of tachycardia.  The patient's oxygen saturations on 3 L in the low 90s.    Fredia Sorrow, MD 12/27/15 Vernelle Emerald  Fredia Sorrow, MD 12/27/15 1824   Addendum:  Patient's chest x-ray without signs of pneumonia pulmonary edema or pneumothorax. Patient's urinalysis significantly infected. Most likely the cause of  the low blood pressures. Patient will be admitted. In addition patient is extremely dehydrated. Patient responding well to fluids so far. Blood pressures are currently 90 systolic. Patient is also more alert mentally. Discussed with hospitalist with for admission.  Fredia Sorrow, MD 12/27/15 [phone]

## 2015-12-28 ENCOUNTER — Other Ambulatory Visit: Payer: Self-pay

## 2015-12-28 DIAGNOSIS — E876 Hypokalemia: Secondary | ICD-10-CM | POA: Diagnosis present

## 2015-12-28 DIAGNOSIS — R778 Other specified abnormalities of plasma proteins: Secondary | ICD-10-CM | POA: Diagnosis present

## 2015-12-28 DIAGNOSIS — N179 Acute kidney failure, unspecified: Secondary | ICD-10-CM | POA: Diagnosis present

## 2015-12-28 DIAGNOSIS — G9340 Encephalopathy, unspecified: Secondary | ICD-10-CM | POA: Diagnosis present

## 2015-12-28 DIAGNOSIS — R7989 Other specified abnormal findings of blood chemistry: Secondary | ICD-10-CM

## 2015-12-28 DIAGNOSIS — E46 Unspecified protein-calorie malnutrition: Secondary | ICD-10-CM | POA: Diagnosis present

## 2015-12-28 DIAGNOSIS — N390 Urinary tract infection, site not specified: Secondary | ICD-10-CM | POA: Diagnosis present

## 2015-12-28 DIAGNOSIS — R5381 Other malaise: Secondary | ICD-10-CM | POA: Diagnosis present

## 2015-12-28 DIAGNOSIS — E870 Hyperosmolality and hypernatremia: Secondary | ICD-10-CM | POA: Diagnosis present

## 2015-12-28 LAB — LACTIC ACID, PLASMA: Lactic Acid, Venous: 1.2 mmol/L (ref 0.5–2.0)

## 2015-12-28 LAB — CBC
HCT: 26.3 % — ABNORMAL LOW (ref 36.0–46.0)
Hemoglobin: 7.9 g/dL — ABNORMAL LOW (ref 12.0–15.0)
MCH: 30.2 pg (ref 26.0–34.0)
MCHC: 30 g/dL (ref 30.0–36.0)
MCV: 100.4 fL — ABNORMAL HIGH (ref 78.0–100.0)
PLATELETS: 224 10*3/uL (ref 150–400)
RBC: 2.62 MIL/uL — ABNORMAL LOW (ref 3.87–5.11)
RDW: 17.5 % — AB (ref 11.5–15.5)
WBC: 18.6 10*3/uL — AB (ref 4.0–10.5)

## 2015-12-28 LAB — COMPREHENSIVE METABOLIC PANEL
ALT: 16 U/L (ref 14–54)
AST: 41 U/L (ref 15–41)
Albumin: 1.7 g/dL — ABNORMAL LOW (ref 3.5–5.0)
Alkaline Phosphatase: 91 U/L (ref 38–126)
Anion gap: 9 (ref 5–15)
BILIRUBIN TOTAL: 0.7 mg/dL (ref 0.3–1.2)
BUN: 87 mg/dL — AB (ref 6–20)
CALCIUM: 7.7 mg/dL — AB (ref 8.9–10.3)
CO2: 17 mmol/L — ABNORMAL LOW (ref 22–32)
CREATININE: 2.21 mg/dL — AB (ref 0.44–1.00)
Chloride: 124 mmol/L — ABNORMAL HIGH (ref 101–111)
GFR calc Af Amer: 22 mL/min — ABNORMAL LOW (ref >60)
GFR, EST NON AFRICAN AMERICAN: 19 mL/min — AB (ref >60)
Glucose, Bld: 75 mg/dL (ref 65–99)
POTASSIUM: 3.4 mmol/L — AB (ref 3.5–5.1)
Sodium: 150 mmol/L — ABNORMAL HIGH (ref 135–145)
TOTAL PROTEIN: 5.2 g/dL — AB (ref 6.5–8.1)

## 2015-12-28 LAB — BASIC METABOLIC PANEL
ANION GAP: 13 (ref 5–15)
BUN: 85 mg/dL — ABNORMAL HIGH (ref 6–20)
CALCIUM: 7.9 mg/dL — AB (ref 8.9–10.3)
CHLORIDE: 124 mmol/L — AB (ref 101–111)
CO2: 13 mmol/L — AB (ref 22–32)
Creatinine, Ser: 2.14 mg/dL — ABNORMAL HIGH (ref 0.44–1.00)
GFR calc non Af Amer: 19 mL/min — ABNORMAL LOW (ref >60)
GFR, EST AFRICAN AMERICAN: 22 mL/min — AB (ref >60)
GLUCOSE: 66 mg/dL (ref 65–99)
POTASSIUM: 3.6 mmol/L (ref 3.5–5.1)
Sodium: 150 mmol/L — ABNORMAL HIGH (ref 135–145)

## 2015-12-28 LAB — CORTISOL: Cortisol, Plasma: 27 ug/dL

## 2015-12-28 LAB — SODIUM, URINE, RANDOM

## 2015-12-28 LAB — TROPONIN I
TROPONIN I: 0.05 ng/mL — AB (ref <0.031)
TROPONIN I: 0.1 ng/mL — AB (ref <0.031)

## 2015-12-28 MED ORDER — ASPIRIN-DIPYRIDAMOLE ER 25-200 MG PO CP12: 1.0000 | ORAL_CAPSULE | Freq: Two times a day (BID) | ORAL | Status: DC

## 2015-12-28 MED ORDER — SODIUM CHLORIDE 0.45 % IV SOLN
INTRAVENOUS | Status: DC
  Administered 2015-12-28: 100 mL/h via INTRAVENOUS
  Administered 2015-12-28: 11:00:00 via INTRAVENOUS
  Administered 2015-12-29: 100 mL/h via INTRAVENOUS
  Administered 2015-12-30: 14:00:00 via INTRAVENOUS

## 2015-12-28 MED ORDER — POTASSIUM CHLORIDE 10 MEQ/100ML IV SOLN
10.0000 meq | INTRAVENOUS | Status: AC
Start: 2015-12-28 — End: 2015-12-28
  Administered 2015-12-28 (×4): 10 meq via INTRAVENOUS
  Filled 2015-12-28 (×4): qty 100

## 2015-12-28 MED ORDER — MORPHINE SULFATE (PF) 4 MG/ML IV SOLN
4.0000 mg | INTRAVENOUS | Status: DC | PRN
  Administered 2015-12-28 – 2015-12-30 (×8): 4 mg via INTRAVENOUS
  Filled 2015-12-28 (×8): qty 1

## 2015-12-28 NOTE — Progress Notes (Addendum)
Day RN reported that the MD gave her an order today to d/c telemetry and she said that prior to her leaving at [place] that she would put the order in if the MD hadn't.  See PN Dr Marily Memos 11:31 that reinforces no more telemetry at this time.

## 2015-12-28 NOTE — Progress Notes (Addendum)
Urine output continues to be low.   Foley is patent.  150cc's in past 8 hours. Which is an improvement.   ivf continue at 100cc/hour.  Pt remains npo.  Will continue to monitor.

## 2015-12-28 NOTE — Progress Notes (Signed)
EKG attempted 3 times. Each time artifact caused an undetermined rhythm. Patient has contractures and slight tremor causing artifact. RN spoke with central telemetry tech Kathlee Nations) and she stated patients underlying rhythm is NSR with Heart rate 78.  Barbee Shropshire. Brigitte Pulse, RN

## 2015-12-28 NOTE — Progress Notes (Signed)
PROGRESS NOTE    Arsema Wertman  [id number] DOB: 28-Jun-1925 DOA: 12/27/2015 PCP: Merlene Laughter, MD    Brief Narrative:  Sandra Flowers is a 82 y.o. female, w/ meningioma, dementia, chronic sacral decubitus ulcer who presented to [facility] long ED on 12/27/2015 with decreased responsiveness/all terminal status which has become progressively worse over the last several days. History provided by patient's daughter the time of admission. Patient had very little oral intake over the last several days and was found in the ED to have a UTI hypotension and acute kidney injury    Assessment & Plan:   Principal Problem:   Hypotension Active Problems:   Decubitus ulcer, infected   Dehydration   UTI (lower urinary tract infection)   Dementia   AKI (acute kidney injury) ([place])   Acute encephalopathy   Elevated troponin   Hypokalemia   Hypernatremia    Acute encephalopathy: Remains nonverbal on 12/28/2015. This is all likely due to underlying dementia with acute UTI. Of note patient hypotension and leukocytosis improving. Lactic acid downtrending from 1.8-1.2, WBC 27 on admission, follow-up CBC pending. - continue Rocephin - f/u UCX - IVF  Elevated Trop: 0.6 on admission w/ repeat 0.05. EKG difficult to interpret due to significant artifact on all EKGs dating back to 12/24/2015. Appeared to be sinus. No significant cardiac history but with risk factors of hypertension, hyperlipidemia and age adn on aggrenox. Family unavailable to provide further information - We'll obtain 1 more troponin to ensure downtrending - No further telemetry - Continue home aggrenox - Echo  Hypernatremia/hyperchloremia: 149/116 on admission, 150/124 on 12/28/2015. Suspect from poor oral intake and volume contracture prior to admission with subsequent administration of normal saline - Change IV F to half-normal saline - BMET Q12  AKI: Baseline creatinine 0.85. Creatinine 2.35 on admission. Improved to 2.21  after IV hydration and treatment of UTI. - Continue IVF and antibiotics as above - BMET in am  HypoK: 3.4 - KCL 59mEq x4 - Mag  Chronic pain: on percocet and straight oxy daily - Morphine PRN until better able to take po  Chronic sacral decubitus: at baseline - air overlay - wound care.        DVT prophylaxis: Heparin Code Status: DNR Family Communication: none Disposition Plan: pending improvement   Consultants:   none  Procedures:   Echo pending  Antimicrobials:  Vanc 6/16 x1  Zosyn 6/16 x1  Ceftriaxone 6/16>>>   Subjective: Patient remains nonverbal. Minimal responsiveness when asking questions. Medication typically comes out his moans or sometimes able to blink eyes in response to questions. Remains relatively still in bed with arms and legs and in traction. Air overlay mattress present. At present. Appears to be in no acute distress.  Objective: Filed Vitals:   12/27/15 1937 12/27/15 2043 12/28/15 0452 12/28/15 0514  BP: 103/68 120/72  132/54  Pulse: 98 126  76  Temp:  97.6 F (36.4 C)    TempSrc:  Axillary    Resp: 9 14  14   Height:  5\' 7"  (1.702 m)    Weight:  58.65 kg (129 lb 4.8 oz) 58.65 kg (129 lb 4.8 oz)   SpO2: 98% 94%  95%    Intake/Output Summary (Last 24 hours) at 12/28/15 1126 Last data filed at 12/28/15 0600  Gross per 24 hour  Intake    975 ml  Output    250 ml  Net    725 ml   Filed Weights   12/27/15 2043 12/28/15 0452  Weight: 58.65 kg (129 lb 4.8 oz) 58.65 kg (129 lb 4.8 oz)    Examination:  General exam: Appears calm and comfortable  Respiratory system: Clear to auscultation. Respiratory effort normal. Cardiovascular system: S1 & S2 heard, RRR. 2/6 systolic murmur. No pedal edema. Gastrointestinal system: Abdomen is nondistended, soft and nontender. No organomegaly or masses felt. Normal bowel sounds heard. Central nervous system: Arms and legs remain in contracture, minimal responsiveness to conversation. Unable  to further assess Extremities: Upper and lower extremities remain in contracture, immediate bony abnormality appreciated.. Skin: Limited exam due to patient's physical mental status. Unable to visualize sacral decubitus ulcer at this time due to patient discomfort and having previously been evaluated. Will defer to wound care team. Psychiatry: Nonverbal at this time    Data Reviewed: I have personally reviewed following labs and imaging studies  CBC:  Recent Labs Lab 12/27/15 1432  WBC 27.6*  HGB 10.1*  HCT 33.5*  MCV 100.3*  PLT [id number]   Basic Metabolic Panel:  Recent Labs Lab 12/27/15 1432 12/28/15 0838  NA 149* 150*  K 3.6 3.4*  CL 116* 124*  CO2 19* 17*  GLUCOSE 84 75  BUN 95* 87*  CREATININE 2.35* 2.21*  CALCIUM 8.8* 7.7*   GFR: Estimated Creatinine Clearance: 16 mL/min (by C-G formula based on Cr of 2.21). Liver Function Tests:  Recent Labs Lab 12/27/15 1432 12/28/15 0838  AST 53* 41  ALT 17 16  ALKPHOS 128* 91  BILITOT 0.8 0.7  PROT 6.9 5.2*  ALBUMIN 2.2* 1.7*   No results for input(s): LIPASE, AMYLASE in the last 168 hours. No results for input(s): AMMONIA in the last 168 hours. Coagulation Profile: No results for input(s): INR, PROTIME in the last 168 hours. Cardiac Enzymes:  Recent Labs Lab 12/27/15 2250 12/28/15 0838  TROPONINI 0.06* 0.05*   BNP (last 3 results) No results for input(s): PROBNP in the last 8760 hours. HbA1C: No results for input(s): HGBA1C in the last 72 hours. CBG:  Recent Labs Lab 12/27/15 1413  GLUCAP 75   Lipid Profile: No results for input(s): CHOL, HDL, LDLCALC, TRIG, CHOLHDL, LDLDIRECT in the last 72 hours. Thyroid Function Tests: No results for input(s): TSH, T4TOTAL, FREET4, T3FREE, THYROIDAB in the last 72 hours. Anemia Panel: No results for input(s): VITAMINB12, FOLATE, FERRITIN, TIBC, IRON, RETICCTPCT in the last 72 hours. Sepsis Labs:  Recent Labs Lab 12/27/15 1845 12/28/15 0838  LATICACIDVEN  1.81 1.2    Recent Results (from the past 240 hour(s))  Blood Culture (routine x 2)     Status: None (Preliminary result)   Collection Time: 12/27/15  6:38 PM  Result Value Ref Range Status   Specimen Description   Final    BLOOD LEFT ANTECUBITAL Performed at [place] 5CC  Final   Culture PENDING  Incomplete   Report Status PENDING  Incomplete  Blood Culture (routine x 2)     Status: None (Preliminary result)   Collection Time: 12/27/15  6:38 PM  Result Value Ref Range Status   Specimen Description   Final    BLOOD LEFT ANTECUBITAL Performed at [facility]    Special Requests BOTTLES DRAWN AEROBIC AND ANAEROBIC 5CC  Final   Culture PENDING  Incomplete   Report Status PENDING  Incomplete  MRSA PCR Screening     Status: Abnormal   Collection Time: 12/27/15  8:30 PM  Result Value Ref Range Status   MRSA  by PCR POSITIVE (A) NEGATIVE Final    Comment:        The GeneXpert MRSA Assay (FDA approved for NASAL specimens only), is one component of a comprehensive MRSA colonization surveillance program. It is not intended to diagnose MRSA infection nor to guide or monitor treatment for MRSA infections. RESULT CALLED TO, READ BACK BY AND VERIFIED WITH: Dalbert Garnet RN @ [id number] ON 12/27/15 BY C DAVIS          Radiology Studies: US Renal Port  12/27/2015  CLINICAL DATA:  Acute renal failure. EXAM: RENAL / URINARY TRACT ULTRASOUND COMPLETE COMPARISON:  None. FINDINGS: Right Kidney: Length: 9.3 cm. The right least 2 cysts in the right kidney, interpolar region measuring 3.1 x 2.5 x 2.6 cm. Posterior interpolar measures 5.9 x 3.5 x 4.5 cm. Technically limited evaluation due to difficulty with patient positioning. Echogenicity within normal limits. No hydronephrosis visualized. Left Kidney: Length: 10.3 cm. Cyst measures 4.8 x 4.0 x 4.7 cm and may have an internal septation. No hydronephrosis visualized.  Bladder: Not evaluated, decompressed by Foley catheter. IMPRESSION: 1. No obstructive uropathy. 2. Bilateral renal cysts. On the left this may have an internal septation. On the right these are difficult to evaluate due to patient positioning. There are no prior exams for comparison, comparison with any prior imaging would be most helpful. This could be further evaluated with renal protocol CT or MRI versus follow-up ultrasound to evaluate for stability, with consideration given to patient age and personal preference. Electronically Signed   By: Jeb Levering M.D.   On: 12/27/2015 23:49   Dg Chest Port 1 View  12/27/2015  CLINICAL DATA:  Shortness of breath. EXAM: PORTABLE CHEST 1 VIEW COMPARISON:  None. FINDINGS: The heart size and mediastinal contours are within normal limits. Both lungs are clear. No pneumothorax or pleural effusion is noted. Narrowing of the subacromial space is noted bilaterally suggesting rotator cuff injury. IMPRESSION: No acute cardiopulmonary abnormality seen. Electronically Signed   By: Marijo Conception, M.D.   On: 12/27/2015 18:19        Scheduled Meds: . cefTRIAXone (ROCEPHIN)  IV  1 g Intravenous Q24H  . Chlorhexidine Gluconate Cloth  6 each Topical Q0600  . dipyridamole-aspirin  1 capsule Oral BID  . heparin  5,000 Units Subcutaneous Q8H  . mupirocin ointment  1 application Nasal BID  . potassium chloride  10 mEq Intravenous Q1 Hr x 4  . sodium chloride  250 mL Intravenous Once  . sodium chloride flush  3 mL Intravenous Q12H   Continuous Infusions: . sodium chloride 100 mL/hr at 12/28/15 1055     LOS: 1 day        MERRELL, DAVID J, MD [facility]  If 7PM-7AM, please contact night-coverage [email or web address] Password [facility] 12/28/2015, 11:26 AM

## 2015-12-29 ENCOUNTER — Inpatient Hospital Stay (HOSPITAL_COMMUNITY): Payer: Medicare Other

## 2015-12-29 DIAGNOSIS — D649 Anemia, unspecified: Secondary | ICD-10-CM | POA: Diagnosis present

## 2015-12-29 DIAGNOSIS — R012 Other cardiac sounds: Secondary | ICD-10-CM

## 2015-12-29 DIAGNOSIS — E43 Unspecified severe protein-calorie malnutrition: Secondary | ICD-10-CM | POA: Diagnosis present

## 2015-12-29 DIAGNOSIS — A419 Sepsis, unspecified organism: Principal | ICD-10-CM

## 2015-12-29 LAB — COMPREHENSIVE METABOLIC PANEL
ALT: 15 U/L (ref 14–54)
AST: 32 U/L (ref 15–41)
Albumin: 1.7 g/dL — ABNORMAL LOW (ref 3.5–5.0)
Alkaline Phosphatase: 85 U/L (ref 38–126)
Anion gap: 9 (ref 5–15)
BUN: 75 mg/dL — ABNORMAL HIGH (ref 6–20)
CHLORIDE: 122 mmol/L — AB (ref 101–111)
CO2: 15 mmol/L — AB (ref 22–32)
Calcium: 7.6 mg/dL — ABNORMAL LOW (ref 8.9–10.3)
Creatinine, Ser: 1.78 mg/dL — ABNORMAL HIGH (ref 0.44–1.00)
GFR, EST AFRICAN AMERICAN: 28 mL/min — AB (ref >60)
GFR, EST NON AFRICAN AMERICAN: 24 mL/min — AB (ref >60)
Glucose, Bld: 48 mg/dL — ABNORMAL LOW (ref 65–99)
POTASSIUM: 3.5 mmol/L (ref 3.5–5.1)
SODIUM: 146 mmol/L — AB (ref 135–145)
Total Bilirubin: 0.5 mg/dL (ref 0.3–1.2)
Total Protein: 5 g/dL — ABNORMAL LOW (ref 6.5–8.1)

## 2015-12-29 LAB — ECHOCARDIOGRAM COMPLETE
E decel time: 132 msec
EERAT: 11.49
FS: 24 % — AB (ref 28–44)
Height: 67 in
IV/PV OW: 1.94
LA diam end sys: 16 mm
LA diam index: 0.91 cm/m2
LA vol A4C: 22.4 ml
LASIZE: 16 mm
LAVOL: 22.5 mL
LAVOLIN: 12.8 mL/m2
LV e' LATERAL: 7.24 cm/s
LVEEAVG: 11.49
LVEEMED: 11.49
LVOT area: 3.14 cm2
LVOT diameter: 20 mm
MV Dec: 132
MV Peak grad: 3 mmHg
MVPKAVEL: 114 m/s
MVPKEVEL: 83.2 m/s
PW: 6.79 mm — AB (ref 0.6–1.1)
TAPSE: 23.8 mm
TDI e' lateral: 7.24
TDI e' medial: 7.02
Weight: 2292.78 oz

## 2015-12-29 LAB — TROPONIN I: TROPONIN I: 0.03 ng/mL (ref <0.031)

## 2015-12-29 LAB — PREPARE RBC (CROSSMATCH)

## 2015-12-29 MED ORDER — SODIUM CHLORIDE 0.9 % IV SOLN
80.0000 mg | Freq: Once | INTRAVENOUS | Status: AC
  Administered 2015-12-29: 80 mg via INTRAVENOUS
  Filled 2015-12-29: qty 80

## 2015-12-29 MED ORDER — SODIUM CHLORIDE 0.9 % IV SOLN
8.0000 mg/h | INTRAVENOUS | Status: DC
  Administered 2015-12-29 – 2016-01-01 (×7): 8 mg/h via INTRAVENOUS
  Filled 2015-12-29 (×14): qty 80

## 2015-12-29 MED ORDER — SODIUM CHLORIDE 0.9 % IV SOLN
Freq: Once | INTRAVENOUS | Status: AC
  Administered 2015-12-29: 22:00:00 via INTRAVENOUS

## 2015-12-29 MED ORDER — BISACODYL 10 MG RE SUPP: 10.0000 mg | Freq: Every day | RECTAL | Status: DC | PRN

## 2015-12-29 MED ORDER — VANCOMYCIN HCL IN DEXTROSE 750-5 MG/150ML-% IV SOLN
750.0000 mg | INTRAVENOUS | Status: DC
  Administered 2015-12-29: 750 mg via INTRAVENOUS
  Filled 2015-12-29: qty 150

## 2015-12-29 MED ORDER — SODIUM CHLORIDE 0.9 % IV BOLUS (SEPSIS)
1000.0000 mL | Freq: Once | INTRAVENOUS | Status: AC
  Administered 2015-12-29: 1000 mL via INTRAVENOUS

## 2015-12-29 NOTE — Progress Notes (Signed)
  Echocardiogram 2D Echocardiogram has been performed.  Jennette Dubin 12/29/2015, 8:41 AM

## 2015-12-29 NOTE — Progress Notes (Addendum)
PROGRESS NOTE    [name]  UYQ:[id number] DOB: 1925-03-10 DOA: 12/27/2015 PCP: Merlene Laughter, MD    Brief Narrative:  Sandra Flowers is a 82 y.o. F w/ chronic sacral decubitus ulcer who presented to [facility] long ED on 12/27/2015 with decreased responsiveness which has become progressively worse over the last several days. Patient had very little oral intake over the last several days and was found in the ED to have a UTI, worsening sacral ulcer, hypotension, hypernatremia and acute kidney injury.   Assessment & Plan: Principal Problem:   Hypotension Active Problems:   Decubitus ulcer, infected   Dehydration   UTI (lower urinary tract infection)   Dementia   AKI (acute kidney injury) (HCC)   Acute encephalopathy   Elevated troponin   Hypokalemia   Hypernatremia   Protein-calorie malnutrition (HCC)   Physical deconditioning   Complicated UTI (urinary tract infection)   Protein-calorie malnutrition, severe    Sepsis, suspected UTI source:  Patient met sepsis criteria at admission, fluids and prompt antibiotics given.  Lactate normalized.   -Continue Ceftriaxone -Start vancomycin given positive MRSA swab -Follow fluid and blood cultures   2. Encephalopathy: From sepsis and hypernatremia.  Dementia at baseline denied by family.  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  Remains nonverbal on 12/29/2015.   3. Elevated Troponin:  Demand ischemia.  Flat trajectory.  Echo shows EF 60%, no wall motion abnormalities. -Hold home Aggrenox given mental status, concern for PO meds at present, restart when able  4. Hypernatremia/hyperchloremia:  149/116 on admission.  From dehydration.  Unable to obtain  -Continue 1/2 NS IVF at maintenance rate 150 cc/hr (suspect fluid loss from wound) -Bolus now -Repeat BMP when able  5. AKI:  Baseline creatinine 0.85. Creatinine 2.35 on admission. Slowly resolving.  From dehydation/pre-renal injury and  sepsis -Trend BMP as able.  6. HypoK:  Repleted.  7. Chronic sacral decubitus:  Unstageable.  Worsening per daughter.  Was admitted here in March for infected sacral decub, debrided by Gen Surg, grew M morganii, discharged on Cipro.  At that time, there was brief discussion of palliative care with family. -Air overlay -wound care consult -Once electrolytes are improved, and mental status clearer, will obtain General Surgery and palliative medicine consults  8. Chronic protein calorie malnutrition, severe: Since January. -Nutrition consult when mental status improves  9. Anemia: Unclear source.  No clinical evidence of bleeding. -Repeat CBC -Transfusion threshold 7 g/dL      DVT prophylaxis: Heparin Code Status: DNR Family Communication: Daughter Sandra Flowers at bedside Disposition Plan: pending improvement   Consultants:   None at present  Procedures:   Echo with normal EF and no wall motion abnormalites, valve disease  Antimicrobials:  Vanc 6/16 x1, then 6/18 >>>  Zosyn 6/16 x1  Ceftriaxone 6/16>>>   Subjective: Patient remains nonverbal. Mumbles and hums in response to sternal rub.  Complains of leg pain, does not localize.    Objective: Filed Vitals:   12/28/15 1456 12/28/15 1945 12/28/15 2159 12/29/15 0500  BP: 125/78  110/52 122/68  Pulse: 63  86 80  Temp: 98 F (36.7 C)  97.8 F (36.6 C) 97.7 F (36.5 C)  TempSrc: Axillary  Axillary Axillary  Resp: 16  18 20   Height:      Weight:  65 kg (143 lb 4.8 oz)    SpO2: 93%  100% 97%    Intake/Output Summary (Last 24 hours) at 12/29/15 1255 Last data filed at  12/29/15 0546  Gross per 24 hour  Intake 2158.34 ml  Output    450 ml  Net 1708.34 ml   Filed Weights   12/27/15 2043 12/28/15 0452 12/28/15 1945  Weight: 58.65 kg (129 lb 4.8 oz) 58.65 kg (129 lb 4.8 oz) 65 kg (143 lb 4.8 oz)    Examination: General exam: Sleeping with eyes closed, mumbles in response to sternal rub, does not coherently  answer questions or open eyes Respiratory system: Clear to auscultation. Respiratory effort normal. Cardiovascular system: S1 & S2 heard, RRR. I do not appreciate murmur noted previously. No pedal edema. Gastrointestinal system: Abdomen is nondistended, soft and nontender.  Central nervous system: Contractures.  Not oriented.  Responsive to touch, not verbal stimuli.  Does not follow commands.   Extremities: No peripheral edema.  No deformities or effusions noted. Skin: Sacral wound, unstageable with adherent necrotic material at base.  Approximately 20 cm wide and 6 cm high, foul odor, moderate serous discharge. Psychiatry: Nonverbal at this time    Data Reviewed: I have personally reviewed following labs and imaging studies  CBC:  Recent Labs Lab 12/27/15 1432 12/28/15 0838  WBC 27.6* 18.6*  HGB 10.1* 7.9*  HCT 33.5* 26.3*  MCV 100.3* 100.4*  PLT 355 629   Basic Metabolic Panel:  Recent Labs Lab 12/27/15 1432 12/28/15 0838 12/28/15 1240  NA 149* 150* 150*  K 3.6 3.4* 3.6  CL 116* 124* 124*  CO2 19* 17* 13*  GLUCOSE 84 75 66  BUN 95* 87* 85*  CREATININE 2.35* 2.21* 2.14*  CALCIUM 8.8* 7.7* 7.9*   GFR: Estimated Creatinine Clearance: 17.3 mL/min (by C-G formula based on Cr of 2.14). Liver Function Tests:  Recent Labs Lab 12/27/15 1432 12/28/15 0838  AST 53* 41  ALT 17 16  ALKPHOS 128* 91  BILITOT 0.8 0.7  PROT 6.9 5.2*  ALBUMIN 2.2* 1.7*   Cardiac Enzymes:  Recent Labs Lab 12/27/15 2250 12/28/15 0838 12/28/15 1240  TROPONINI 0.06* 0.05* 0.10*   CBG:  Recent Labs Lab 12/27/15 1413  GLUCAP 75   Sepsis Labs:  Recent Labs Lab 12/27/15 1845 12/28/15 0838  LATICACIDVEN 1.81 1.2    Recent Results (from the past 240 hour(s))  Urine culture     Status: None (Preliminary result)   Collection Time: 12/27/15  5:52 PM  Result Value Ref Range Status   Specimen Description URINE, CATHETERIZED  Final   Special Requests NONE  Final   Culture    Final    CULTURE REINCUBATED FOR BETTER GROWTH Performed at [facility]    Report Status PENDING  Incomplete  Blood Culture (routine x 2)     Status: None (Preliminary result)   Collection Time: 12/27/15  6:38 PM  Result Value Ref Range Status   Specimen Description BLOOD LEFT ANTECUBITAL  Final   Special Requests BOTTLES DRAWN AEROBIC AND ANAEROBIC 5CC  Final   Culture   Final    NO GROWTH < 24 HOURS Performed at [facility]    Report Status PENDING  Incomplete  Blood Culture (routine x 2)     Status: None (Preliminary result)   Collection Time: 12/27/15  6:38 PM  Result Value Ref Range Status   Specimen Description BLOOD LEFT ANTECUBITAL  Final   Special Requests BOTTLES DRAWN AEROBIC AND ANAEROBIC 5CC  Final   Culture   Final    NO GROWTH < 24 HOURS Performed at [facility]    Report Status PENDING  Incomplete  MRSA PCR Screening     Status: Abnormal   Collection Time: 12/27/15  8:30 PM  Result Value Ref Range Status   MRSA by PCR POSITIVE (A) NEGATIVE Final    Comment:        The GeneXpert MRSA Assay (FDA approved for NASAL specimens only), is one component of a comprehensive MRSA colonization surveillance program. It is not intended to diagnose MRSA infection nor to guide or monitor treatment for MRSA infections. RESULT CALLED TO, READ BACK BY AND VERIFIED WITH: Dalbert Garnet RN @ [phone] ON 12/27/15 BY C DAVIS          Radiology Studies: US Renal Port  12/27/2015  CLINICAL DATA:  Acute renal failure. EXAM: RENAL / URINARY TRACT ULTRASOUND COMPLETE COMPARISON:  None. FINDINGS: Right Kidney: Length: 9.3 cm. The right least 2 cysts in the right kidney, interpolar region measuring 3.1 x 2.5 x 2.6 cm. Posterior interpolar measures 5.9 x 3.5 x 4.5 cm. Technically limited evaluation due to difficulty with patient positioning. Echogenicity within normal limits. No hydronephrosis visualized. Left Kidney: Length: 10.3 cm. Cyst measures 4.8 x 4.0 x  4.7 cm and may have an internal septation. No hydronephrosis visualized. Bladder: Not evaluated, decompressed by Foley catheter. IMPRESSION: 1. No obstructive uropathy. 2. Bilateral renal cysts. On the left this may have an internal septation. On the right these are difficult to evaluate due to patient positioning. There are no prior exams for comparison, comparison with any prior imaging would be most helpful. This could be further evaluated with renal protocol CT or MRI versus follow-up ultrasound to evaluate for stability, with consideration given to patient age and personal preference. Electronically Signed   By: Jeb Levering M.D.   On: 12/27/2015 23:49   Dg Chest Port 1 View  12/27/2015  CLINICAL DATA:  Shortness of breath. EXAM: PORTABLE CHEST 1 VIEW COMPARISON:  None. FINDINGS: The heart size and mediastinal contours are within normal limits. Both lungs are clear. No pneumothorax or pleural effusion is noted. Narrowing of the subacromial space is noted bilaterally suggesting rotator cuff injury. IMPRESSION: No acute cardiopulmonary abnormality seen. Electronically Signed   By: Marijo Conception, M.D.   On: 12/27/2015 18:19        Scheduled Meds: . cefTRIAXone (ROCEPHIN)  IV  1 g Intravenous Q24H  . Chlorhexidine Gluconate Cloth  6 each Topical Q0600  . heparin  5,000 Units Subcutaneous Q8H  . mupirocin ointment  1 application Nasal BID  . sodium chloride  1,000 mL Intravenous Once  . sodium chloride  250 mL Intravenous Once  . sodium chloride flush  3 mL Intravenous Q12H   Continuous Infusions: . sodium chloride 100 mL/hr (12/29/15 0546)     LOS: 2 days        Edwin Dada, MD [facility]  If 7PM-7AM, please contact night-coverage [email or web address] Password [facility] 12/29/2015, 12:55 PM

## 2015-12-29 NOTE — Progress Notes (Signed)
Urine output has increased this past 8 hours up to 200cc's.  Pt more alert, is talking small amounts answering questions appropriately, but is showing increase in level of pain with moaning and physical cues this shift.

## 2015-12-29 NOTE — Progress Notes (Signed)
Initial Nutrition Assessment  DOCUMENTATION CODES:   Severe malnutrition in context of acute illness/injury  INTERVENTION:   -Diet advancement per MD -recommend Dysphagia 2 (finely chopped) diet -Once diet is advanced, provide Prostat liquid protein PO 30 ml TID with meals, each supplement provides 100 kcal, 15 grams protein. -Provide Magic cup TID with meals, each supplement provides 290 kcal and 9 grams of protein -Provide Multivitamin with minerals daily  RD to continue to monitor  NUTRITION DIAGNOSIS:   Increased nutrient needs related to wound healing as evidenced by estimated needs.  GOAL:   Patient will meet greater than or equal to 90% of their needs  MONITOR:   Diet advancement, Labs, Weight trends, Skin, I & O's  REASON FOR ASSESSMENT:   Malnutrition Screening Tool    ASSESSMENT:   82 y.o. female, w/ meningioma, dementia, chronic sacral decubitus ulcer who presented to [facility] long ED on 12/27/2015 with decreased responsiveness/all terminal status which has become progressively worse over the last several days. History provided by patient's daughter the time of admission. Patient had very little oral intake over the last several days and was found in the ED to have a UTI hypotension and acute kidney injury   Patient in room asleep with daughter at bedside. Per pt's daughter, the pt has been eating 30-35% of meals for the last 2 weeks at her facility [facility]). Pt has been craving sweets and was eating Magic cups at her facility. Pt was ordered a mechanical soft diet. Pt's daughter feels that the patient would do well with finely chopped foods. Over the last week the patient was not wanting to eat at all. She was ordered Prostat supplements and was taking these. Pt's daughter states that the patient was having issues with her dentures as well. She will not eat if her dentures are not securely in her mouth. Pt's daughter has her dentures once pt is able to eat  again. Pt is currently NPO. Would resume Multivitamin with minerals daily and Vitamin C regimen once diet is advanced given multiple pressure wounds.  Per weight history, pt has lost 17 lb since 3/16 (11% wt loss x 3 months, significant for time frame). Unable to perform NFPE, pt does present with severe depletion in temporal region.   Medications reviewed. Labs reviewed: Elevated Na  Diet Order:  Diet NPO time specified  Skin:    infected sacral ulcer, foot wound, unstageable ankle and heel wounds  Last BM:  PTA  Height:   Ht Readings from Last 1 Encounters:  12/27/15 5\' 7"  (1.702 m)    Weight:   Wt Readings from Last 1 Encounters:  12/28/15 143 lb 4.8 oz (65 kg)    Ideal Body Weight:  61.4 kg  BMI:  Body mass index is 22.44 kg/(m^2).  Estimated Nutritional Needs:   Kcal:  2000-2200  Protein:  95-105g  Fluid:  2L/day  EDUCATION NEEDS:   No education needs identified at this time  Clayton Bibles, MS, RD, LDN Pager: [phone] After Hours Pager: [phone]

## 2015-12-29 NOTE — Progress Notes (Addendum)
Pharmacy Antibiotic Note  Sandra Flowers is a 82 y.o. female admitted on 12/27/2015 with sepsis (? UTI as source). Has decubitus ulcers but not clear if acutely infected  Pharmacy has been consulted for vancomycin dosing. Patient also currently on ceftriaxone. She was given vancomycin 1gm x 1 on 6/16 at 1800.  Today, 12/29/2015  Day #2 antibiotics  WBC elevated but improved (may be related to MIVF as pt dehydrated at presentation)  Renal: AKI  No fevers  Cultures unrevealing to date (Urine cx reincubated)  Plan:  Vancomycin 750mg  q48 h to start 48h from ED dose  Follow renal function and check levels as needed  Suggest narrow antibiotics with final urine cx result  Height: 5\' 7"  (170.2 cm) Weight: 143 lb 4.8 oz (65 kg) IBW/kg (Calculated) : 61.6  Temp (24hrs), Avg:97.8 F (36.6 C), Min:97.7 F (36.5 C), Max:98 F (36.7 C)   Recent Labs Lab 12/27/15 1432 12/27/15 1845 12/28/15 0838 12/28/15 1240  WBC 27.6*  --  18.6*  --   CREATININE 2.35*  --  2.21* 2.14*  LATICACIDVEN  --  1.81 1.2  --     Estimated Creatinine Clearance: 17.3 mL/min (by C-G formula based on Cr of 2.14).    No Known Allergies  Antimicrobials this admission: 6/16 >>zosyn x 1 6/16 >> ceftriaxone 6/16 >> vanco  Dose adjustments this admission:   Microbiology results: 6/16 BCx: NGTD 6/16 UCx: pending 6/18 MRSA PCR: positive  Thank you for allowing pharmacy to be a part of this patient's care.  Doreene Eland, PharmD, BCPS.   Pager: [phone] 12/29/2015 1:00 PM

## 2015-12-29 NOTE — Progress Notes (Signed)
Pt is a difficult stick and labswas just able to obtain labs.  Hb had been 7.9 and now it returns at 6.8.  Just had an incontinent dark, loose small stool, VSS.  IVF are running at 125 as ordered.  PN states hx of anemia and threshold for transfusion 7.0.  Have notified L. Harduk on call.

## 2015-12-30 LAB — CBC
HCT: 22.8 % — ABNORMAL LOW (ref 36.0–46.0)
Hemoglobin: 6.8 g/dL — CL (ref 12.0–15.0)
MCH: 29.7 pg (ref 26.0–34.0)
MCHC: 29.8 g/dL — ABNORMAL LOW (ref 30.0–36.0)
MCV: 99.6 fL (ref 78.0–100.0)
PLATELETS: 224 10*3/uL (ref 150–400)
RBC: 2.29 MIL/uL — AB (ref 3.87–5.11)
RDW: 17.4 % — AB (ref 11.5–15.5)
WBC: 15.3 10*3/uL — AB (ref 4.0–10.5)

## 2015-12-30 LAB — PROTEIN ELECTROPHORESIS, SERUM
A/G RATIO SPE: 0.6 — AB (ref 0.7–1.7)
ALBUMIN ELP: 1.6 g/dL — AB (ref 2.9–4.4)
ALPHA-1-GLOBULIN: 0.3 g/dL (ref 0.0–0.4)
ALPHA-2-GLOBULIN: 1 g/dL (ref 0.4–1.0)
BETA GLOBULIN: 0.6 g/dL — AB (ref 0.7–1.3)
GAMMA GLOBULIN: 1 g/dL (ref 0.4–1.8)
Globulin, Total: 2.9 g/dL (ref 2.2–3.9)
Total Protein ELP: 4.5 g/dL — ABNORMAL LOW (ref 6.0–8.5)

## 2015-12-30 LAB — CBC WITH DIFFERENTIAL/PLATELET
BASOS ABS: 0 10*3/uL (ref 0.0–0.1)
Basophils Relative: 0 %
EOS ABS: 0.3 10*3/uL (ref 0.0–0.7)
Eosinophils Relative: 2 %
HCT: 30.9 % — ABNORMAL LOW (ref 36.0–46.0)
HEMOGLOBIN: 10.2 g/dL — AB (ref 12.0–15.0)
LYMPHS PCT: 6 %
Lymphs Abs: 0.9 10*3/uL (ref 0.7–4.0)
MCH: 29.1 pg (ref 26.0–34.0)
MCHC: 33 g/dL (ref 30.0–36.0)
MCV: 88 fL (ref 78.0–100.0)
Monocytes Absolute: 0.6 10*3/uL (ref 0.1–1.0)
Monocytes Relative: 4 %
NEUTROS ABS: 13.2 10*3/uL — AB (ref 1.7–7.7)
Neutrophils Relative %: 88 %
Platelets: ADEQUATE 10*3/uL (ref 150–400)
RBC: 3.51 MIL/uL — ABNORMAL LOW (ref 3.87–5.11)
RDW: 20.5 % — AB (ref 11.5–15.5)
WBC: 15 10*3/uL — AB (ref 4.0–10.5)

## 2015-12-30 LAB — URINE CULTURE: Culture: >=100000 — AB

## 2015-12-30 LAB — OCCULT BLOOD X 1 CARD TO LAB, STOOL: FECAL OCCULT BLD: POSITIVE — AB

## 2015-12-30 MED ORDER — POTASSIUM CHLORIDE 2 MEQ/ML IV SOLN
INTRAVENOUS | Status: DC
  Filled 2015-12-30: qty 1000

## 2015-12-30 MED ORDER — MORPHINE SULFATE (PF) 2 MG/ML IV SOLN
1.0000 mg | INTRAVENOUS | Status: DC | PRN
  Administered 2015-12-30 – 2016-01-02 (×6): 2 mg via INTRAVENOUS
  Administered 2016-01-02 (×2): 1 mg via INTRAVENOUS
  Administered 2016-01-03 (×2): 2 mg via INTRAVENOUS
  Filled 2015-12-30 (×11): qty 1

## 2015-12-30 MED ORDER — IMIPENEM-CILASTATIN 500 MG IV SOLR
500.0000 mg | Freq: Two times a day (BID) | INTRAVENOUS | Status: DC
  Filled 2015-12-30: qty 500

## 2015-12-30 MED ORDER — SODIUM CHLORIDE 0.9 % IV SOLN
250.0000 mg | Freq: Three times a day (TID) | INTRAVENOUS | Status: DC
  Administered 2015-12-30 – 2015-12-31 (×2): 250 mg via INTRAVENOUS
  Filled 2015-12-30 (×3): qty 250

## 2015-12-30 MED ORDER — KCL IN DEXTROSE-NACL 10-5-0.45 MEQ/L-%-% IV SOLN
INTRAVENOUS | Status: DC
  Administered 2015-12-30 – 2015-12-31 (×3): via INTRAVENOUS
  Filled 2015-12-30 (×3): qty 1000

## 2015-12-30 MED ORDER — SODIUM CHLORIDE 0.45 % IV SOLN
INTRAVENOUS | Status: DC
  Administered 2015-12-30: 23:00:00 via INTRAVENOUS

## 2015-12-30 MED ORDER — SODIUM CHLORIDE 0.9 % IV SOLN
250.0000 mg | Freq: Three times a day (TID) | INTRAVENOUS | Status: DC
  Administered 2015-12-30: 250 mg via INTRAVENOUS
  Filled 2015-12-30 (×3): qty 250

## 2015-12-30 NOTE — Progress Notes (Signed)
PROGRESS NOTE  Sandra Flowers  RFF:[id number] DOB: 05/06/1925 DOA: 12/27/2015 PCP: Merlene Laughter, MD   Brief Narrative:  Sandra Flowers is a 82 y.o. F w/ chronic sacral decubitus ulcer who presented to [facility] long ED on 12/27/2015 with decreased responsiveness which has become progressively worse over the last several days. Patient had very little oral intake over the last several days and was found in the ED to have a UTI, worsening sacral ulcer, hypotension, hypernatremia and acute kidney injury.  Subjective: Answers simple questions, confused and disoriented. Able to tell her name.    Assessment & Plan: Active Problems:   Decubitus ulcer, infected   Dehydration   AKI (acute kidney injury) ([place])   Acute encephalopathy   Elevated troponin   Hypokalemia   Hypernatremia   Protein-calorie malnutrition (HCC)   Physical deconditioning   Complicated UTI (urinary tract infection)   Protein-calorie malnutrition, severe   Normocytic anemia    Sepsis Patient met sepsis criteria at admission, fluids and prompt antibiotics given.  Lactate normalized.   Started on antibiotics and IV fluids of admission. Lactic acid was 1.8 on admission. Sepsis pathophysiology resolved.  ESBL UTI Urinalysis was positive for UTI on admission, grew 2 organisms, ESBL Klebsiella and Proteus. Antibiotics switched to Primaxin, initially she was on Rocephin.  Acute metabolic encephalopathy: From sepsis and hypernatremia.  Dementia at baseline denied by family.  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    Elevated Troponin:  Demand ischemia.  Flat trajectory.  Echo shows EF 60%, no wall motion abnormalities. -Hold home Aggrenox given mental status, concern for PO meds at present, restart when able  Hypernatremia/hyperchloremia:  149/116 on admission.  From dehydration likely secondary to UTI and poor oral intake. We'll place on D5/1/2 NS her today, check BMP in  a.m.  AKI:  Baseline creatinine 0.85. Creatinine 2.35 on admission. Slowly resolving.  From dehydation/pre-renal injury and sepsis This is improved with IV fluid hydration. Still elevated at 1.7.  HypoK:  Repleted.  Chronic sacral decubitus:  Unstageable.  Worsening per daughter.  Was admitted here in March for infected sacral decub, debrided by Gen Surg, grew M morganii, discharged on Cipro.  At that time, there was brief discussion of palliative care with family. -Air overlay -wound care consult -Once electrolytes are improved, and mental status clearer, will obtain General Surgery and palliative medicine consults. -Seen by wound care team and recommended surgical consultation, will hold onto that as she might get palliative medicine consult.  Chronic protein calorie malnutrition, severe: Since January. -Nutrition consult when mental status improves  Anemia , acute on chronic anemia likely from acute illness. -Hemoglobin dropped down to 6.8, status post transfusion of 2 units of packed RBCs. -Has positive Hemoccult, unclear if that the source with unstageable sacral decubitus ulcer can probably affect the positivity of the Hemoccult.    DVT prophylaxis: Heparin Code Status: DNR Family Communication: Called her daughter Ariadne Rissmiller twice without success to discuss palliative care. isposition Plan: pending improvement  Consultants:   None at present  Procedures:   Echo with normal EF and no wall motion abnormalites, valve disease  Antimicrobials:  Vanc 6/16 x1, then 6/18 >>>  Zosyn 6/16 x1  Ceftriaxone 6/16>>>   Objective: Filed Vitals:   12/30/15 0256 12/30/15 0310 12/30/15 0311 12/30/15 0626  BP: 100/54  100/62 99/65  Pulse: 92  106 90  Temp: 97.7 F (36.5 C)  97.7 F (36.5 C) 97.6 F (36.4 C)  TempSrc:  Axillary  Axillary Axillary  Resp: 20  22 20   Height:      Weight:  67.903 kg (149 lb 11.2 oz)    SpO2: 98%  100% 100%    Intake/Output Summary  (Last 24 hours) at 12/30/15 1345 Last data filed at 12/30/15 0626  Gross per 24 hour  Intake 4080.83 ml  Output    851 ml  Net 3229.83 ml   Filed Weights   12/28/15 0452 12/28/15 1945 12/30/15 0310  Weight: 58.65 kg (129 lb 4.8 oz) 65 kg (143 lb 4.8 oz) 67.903 kg (149 lb 11.2 oz)    Examination: General exam: Sleeping with eyes closed, mumbles in response to sternal rub, does not coherently answer questions or open eyes Respiratory system: Clear to auscultation. Respiratory effort normal. Cardiovascular system: S1 & S2 heard, RRR. I do not appreciate murmur noted previously. No pedal edema. Gastrointestinal system: Abdomen is nondistended, soft and nontender.  Central nervous system: Contractures.  Not oriented.  Responsive to touch, not verbal stimuli.  Does not follow commands.   Extremities: No peripheral edema.  No deformities or effusions noted. Skin: Sacral wound, unstageable with adherent necrotic material at base.  Approximately 20 cm wide and 6 cm high, foul odor, moderate serous discharge. Psychiatry: Nonverbal at this time    Data Reviewed: I have personally reviewed following labs and imaging studies  CBC:  Recent Labs Lab 12/27/15 1432 12/28/15 0838 12/29/15 1949 12/30/15 0855  WBC 27.6* 18.6* 15.3* 15.0*  NEUTROABS  --   --   --  13.2*  HGB 10.1* 7.9* 6.8* 10.2*  HCT 33.5* 26.3* 22.8* 30.9*  MCV 100.3* 100.4* 99.6 88.0  PLT 355 224 224 PLATELET CLUMPS NOTED ON SMEAR, COUNT APPEARS ADEQUATE   Basic Metabolic Panel:  Recent Labs Lab 12/27/15 1432 12/28/15 0838 12/28/15 1240 12/29/15 1949  NA 149* 150* 150* 146*  K 3.6 3.4* 3.6 3.5  CL 116* 124* 124* 122*  CO2 19* 17* 13* 15*  GLUCOSE 84 75 66 48*  BUN 95* 87* 85* 75*  CREATININE 2.35* 2.21* 2.14* 1.78*  CALCIUM 8.8* 7.7* 7.9* 7.6*   GFR: Estimated Creatinine Clearance: 20.8 mL/min (by C-G formula based on Cr of 1.78). Liver Function Tests:  Recent Labs Lab 12/27/15 1432 12/28/15 0838  12/29/15 1949  AST 53* 41 32  ALT 17 16 15   ALKPHOS 128* 91 85  BILITOT 0.8 0.7 0.5  PROT 6.9 5.2* 5.0*  ALBUMIN 2.2* 1.7* 1.7*   Cardiac Enzymes:  Recent Labs Lab 12/27/15 2250 12/28/15 0838 12/28/15 1240 12/29/15 1949  TROPONINI 0.06* 0.05* 0.10* 0.03   CBG:  Recent Labs Lab 12/27/15 1413  GLUCAP 75   Sepsis Labs:  Recent Labs Lab 12/27/15 1845 12/28/15 0838  LATICACIDVEN 1.81 1.2    Recent Results (from the past 240 hour(s))  Urine culture     Status: Abnormal   Collection Time: 12/27/15  5:52 PM  Result Value Ref Range Status   Specimen Description URINE, CATHETERIZED  Final   Special Requests NONE  Final   Culture (A)  Final    >=100,000 COLONIES/mL PROTEUS MIRABILIS >=100,000 COLONIES/mL KLEBSIELLA PNEUMONIAE Confirmed Extended Spectrum Beta-Lactamase Producer (ESBL) Performed at [facility]    Report Status 12/30/2015 FINAL  Final   Organism ID, Bacteria PROTEUS MIRABILIS (A)  Final   Organism ID, Bacteria KLEBSIELLA PNEUMONIAE (A)  Final      Susceptibility   Klebsiella pneumoniae - MIC*    AMPICILLIN >=32 RESISTANT Resistant  CEFAZOLIN >=64 RESISTANT Resistant     CEFTRIAXONE >=64 RESISTANT Resistant     CIPROFLOXACIN 1 SENSITIVE Sensitive     GENTAMICIN <=1 SENSITIVE Sensitive     IMIPENEM <=0.25 SENSITIVE Sensitive     NITROFURANTOIN 64 INTERMEDIATE Intermediate     TRIMETH/SULFA >=320 RESISTANT Resistant     AMPICILLIN/SULBACTAM >=32 RESISTANT Resistant     PIP/TAZO 32 INTERMEDIATE Intermediate     * >=100,000 COLONIES/mL KLEBSIELLA PNEUMONIAE   Proteus mirabilis - MIC*    AMPICILLIN <=2 SENSITIVE Sensitive     CEFAZOLIN <=4 SENSITIVE Sensitive     CEFTRIAXONE <=1 SENSITIVE Sensitive     CIPROFLOXACIN >=4 RESISTANT Resistant     GENTAMICIN <=1 SENSITIVE Sensitive     IMIPENEM 2 SENSITIVE Sensitive     NITROFURANTOIN 128 RESISTANT Resistant     TRIMETH/SULFA <=20 SENSITIVE Sensitive     AMPICILLIN/SULBACTAM <=2 SENSITIVE  Sensitive     PIP/TAZO <=4 SENSITIVE Sensitive     * >=100,000 COLONIES/mL PROTEUS MIRABILIS  Blood Culture (routine x 2)     Status: None (Preliminary result)   Collection Time: 12/27/15  6:38 PM  Result Value Ref Range Status   Specimen Description BLOOD LEFT ANTECUBITAL  Final   Special Requests BOTTLES DRAWN AEROBIC AND ANAEROBIC 5CC  Final   Culture   Final    NO GROWTH 2 DAYS Performed at [facility]    Report Status PENDING  Incomplete  Blood Culture (routine x 2)     Status: None (Preliminary result)   Collection Time: 12/27/15  6:38 PM  Result Value Ref Range Status   Specimen Description BLOOD LEFT ANTECUBITAL  Final   Special Requests BOTTLES DRAWN AEROBIC AND ANAEROBIC 5CC  Final   Culture   Final    NO GROWTH 2 DAYS Performed at [facility]    Report Status PENDING  Incomplete  MRSA PCR Screening     Status: Abnormal   Collection Time: 12/27/15  8:30 PM  Result Value Ref Range Status   MRSA by PCR POSITIVE (A) NEGATIVE Final    Comment:        The GeneXpert MRSA Assay (FDA approved for NASAL specimens only), is one component of a comprehensive MRSA colonization surveillance program. It is not intended to diagnose MRSA infection nor to guide or monitor treatment for MRSA infections. RESULT CALLED TO, READ BACK BY AND VERIFIED WITH: Dalbert Garnet RN @ [phone] ON 12/27/15 BY C DAVIS          Radiology Studies: No results found.      Scheduled Meds: . Chlorhexidine Gluconate Cloth  6 each Topical Q0600  . heparin  5,000 Units Subcutaneous Q8H  . imipenem-cilastatin  250 mg Intravenous Q8H  . mupirocin ointment  1 application Nasal BID  . sodium chloride  250 mL Intravenous Once  . sodium chloride flush  3 mL Intravenous Q12H  . vancomycin  750 mg Intravenous Q48H   Continuous Infusions: . sodium chloride 125 mL/hr at 12/29/15 1722  . pantoprozole (PROTONIX) infusion 8 mg/hr (12/30/15 0937)     LOS: 3 days         Vonya Ohalloran A, MD [facility]  If 7PM-7AM, please contact night-coverage [email or web address] Password [email or web address] 12/30/2015, 1:45 PM

## 2015-12-30 NOTE — Consult Note (Addendum)
WOC wound consult note Reason for Consult: Consult requested for multiple wounds.  Family is not present to discuss plan of care. Pt had surgical debridement of sacral wound in March 2017, according to the EMR Wound type: Sacrum is stage 4 since bone is palpable with a swab.  6.5X4X8cm with 4 cm undermining to wound edges.  There is a large amount of slough/eschar in the inner wound; approx 50% red, 50% nonviable tissue.  Large amt strong foul odor and tan drainage.  Pt is frequently incontinent of stool and it is difficult to keep wound from becoming soiled since location is close to the rectum.   Left ischium with previous unstageable wound which has evolved into stage 3 pressure injury.  2X1.5cm, 90% red, 10% yellow, small amt tan drainage, no odor Left heel with unstageable pressure injury; 9X9cm, 50% deep tissue injury, 50% eschar.  No odor or drainage.  Left ankle with deep tissue injury .5X.5cm Right ischium with previous unstageable wound which has evolved into stage 3 pressure injury.  2X2cm, 90% red, 10% yellow, small amt tan drainage, no odor Right heel with unstageable pressure injury; 8X8 cm, 80% deep tissue injury, 20% eschar.  No odor or drainage.  Right outer ankle with several deep tissue injuries; affected area 8X3 cm Right lower calf with full thickness wound; 5.5X.2X.3cm, black dry wound bed, no odor, small amt tan drainage. Pressure Ulcer POA: Yes Dressing procedure/placement/frequency: Pt is on an air mattress to decrease pressure and had dietary consult to optimize nutrition.  Discussed plan of care with primary team; if aggressive plan of care is desired, then recommend surgical consult for debridement of nonviable tissue.  Pt could benefit from palliative care consult to discuss goals of care when family is present. Foam dressings to left and right ischium, left and right heels, and left and right ankles.  Moist gauze dressings to sacrum and right leg wounds. Greater than one  hour was spent performing this consult. Please re-consult if further assistance is needed.  Thank-you,  Julien Girt MSN, [place], [place], [place], [place]

## 2015-12-30 NOTE — Progress Notes (Signed)
Pharmacy Antibiotic Note  Mayo Hornacek is a 82 y.o. female admitted on 12/27/2015 with sepsis, possibly UTI or decubitus ulcer as source.  Her decubitus ulcers has odor, and may require debridement, but not clear if acutely infected  Pharmacy has been consulted for vancomycin dosing already.  Pharmacy is now consulted to change Ceftriaxone to Imipenem for UTI coverage of ESBL producing organisms.  Today, 12/30/2015   Day #3 antibiotics  WBC elevated but improving slowly  Renal: AKI; no new SCr today (lab unable to get large enough blood sample).  CrCl ~ 21 ml/min CG, ~ 24 ml/min N  Afebrile   Plan: D/C ceftriaxone Primaxin 250 mg IV q8h Continue Vancomycin 750 mg IV q48h.  To start 48h after ED dose Measure Vanc trough at steady state. Follow up renal fxn, culture results, and clinical course.   Height: 5\' 7"  (170.2 cm) Weight: 149 lb 11.2 oz (67.903 kg) IBW/kg (Calculated) : 61.6  Temp (24hrs), Avg:97.6 F (36.4 C), Min:97.3 F (36.3 C), Max:97.7 F (36.5 C)   Recent Labs Lab 12/27/15 1432 12/27/15 1845 12/28/15 0838 12/28/15 1240 12/29/15 1949 12/30/15 0855  WBC 27.6*  --  18.6*  --  15.3* 15.0*  CREATININE 2.35*  --  2.21* 2.14* 1.78*  --   LATICACIDVEN  --  1.81 1.2  --   --   --     Estimated Creatinine Clearance: 20.8 mL/min (by C-G formula based on Cr of 1.78).    No Known Allergies  Antimicrobials this admission: 6/16 >>zosyn x 1 6/16 >> ceftriaxone >> 6/19 6/16 >> vanc >> 6/19 >> Imipenem >>  Dose adjustments this admission:   Microbiology results: 6/16 BCx: NGTD 6/16 UCx: >100k ESBL Klebsiella Pneumo (sens: cipro, gent, imipenem) and > 100k Proteus (pan-sensitive except cipro and NTF) 6/18 MRSA PCR: positive  Thank you for allowing pharmacy to be a part of this patient's care.  Gretta Arab PharmD, BCPS Pager [phone] 12/30/2015 1:17 PM

## 2015-12-30 NOTE — Progress Notes (Signed)
Stool came back guiac positive

## 2015-12-30 NOTE — Evaluation (Signed)
Clinical/Bedside Swallow Evaluation Patient Details  Name: Majesty Stehlin MRN: [id number] Date of Birth: [birth year]  Today's Date: 12/30/2015 Time: SLP Start Time (ACUTE ONLY): 62 SLP Stop Time (ACUTE ONLY): 1036 SLP Time Calculation (min) (ACUTE ONLY): 16 min  Past Medical History:  Past Medical History  Diagnosis Date  . Hypertension   . Hyperlipidemia   . Meningioma ([place]) y-10  . Arthritis   . Hearing loss m-6  . Dementia    Past Surgical History:  Past Surgical History  Procedure Laterality Date  . Cholecystectomy    . Abdominal hysterectomy    . Debridment of decubitus ulcer N/A 09/27/2015    Procedure: DEBRIDMENT OF DECUBITUS ULCER;  Surgeon: Leighton Ruff, MD;  Location: [facility] ORS;  Service: General;  Laterality: N/A;   HPI:  Celene Pippins is a 82 y.o. female, w/ meningioma, dementia, sacral decubitus ulcer who apparently has had decrease in responsiveness over the past several days according to her daughters. Pt has had decrease in po intake. Pt was brought to ED for evaluation and found to have UTI, Hypotension and ARF. Pt will be admitted for hypotension. No acute cardiopulmoanry abnormality   Assessment / Plan / Recommendation Clinical Impression  Pt demosntrates inability/unwillingness to attempt PO due to mental status. SLP provided repositioning, verbal and tactile cueing and oral care to increase pt awareness of PO trials. All efforts were met with pt refusal, either verbal or by turning head. pt would not orally accpet ice chip and drips of water on lips did not elicit appropriate lingual or labial manipulation. Hand over hand assist not possible due to UE spasticity. Pt would benefit from offerings of PO for comfort if a pallative approach is anticipated. Otherwise, keep pt NPO until mentation improves. Will follow for plan of care/interventions as needed.     Aspiration Risk  Severe aspiration risk;Risk for inadequate nutrition/hydration    Diet Recommendation  NPO        Other  Recommendations Oral Care Recommendations: Oral care QID Other Recommendations: Have oral suction available   Follow up Recommendations  Skilled Nursing facility    Frequency and Duration min 2x/week  2 weeks       Prognosis Prognosis for Safe Diet Advancement: Guarded Barriers to Reach Goals: Cognitive deficits;Severity of deficits;Behavior      Swallow Study   General HPI: Tereasa Yilmaz is a 82 y.o. female, w/ meningioma, dementia, sacral decubitus ulcer who apparently has had decrease in responsiveness over the past several days according to her daughters. Pt has had decrease in po intake. Pt was brought to ED for evaluation and found to have UTI, Hypotension and ARF. Pt will be admitted for hypotension. No acute cardiopulmoanry abnormality Type of Study: Bedside Swallow Evaluation Previous Swallow Assessment: none in chart Diet Prior to this Study: NPO Temperature Spikes Noted: No Respiratory Status: Room air History of Recent Intubation: No Behavior/Cognition: Uncooperative;Doesn't follow directions (awake keeps eyes closed) Oral Cavity Assessment: Dried secretions (dry secretions on lips/teeth. tongue moist) Oral Care Completed by SLP: Yes Oral Cavity - Dentition: Poor condition;Missing dentition Vision: Impaired for self-feeding Self-Feeding Abilities: Total assist;Refused PO Patient Positioning: Partially reclined Baseline Vocal Quality: Normal Volitional Cough: Cognitively unable to elicit Volitional Swallow: Unable to elicit    Oral/Motor/Sensory Function Overall Oral Motor/Sensory Function: Other (comment) (doesnt follow commands, able to open mouth)   Ice Chips Ice chips: Impaired Presentation: Spoon Oral Phase Impairments: Poor awareness of bolus   Thin Liquid Thin Liquid: Impaired Presentation: Spoon Oral Phase Impairments:  Poor awareness of bolus    Nectar Thick Nectar Thick Liquid: Not tested   Honey Thick Honey Thick Liquid: Not  tested   Puree Puree: Not tested   Solid   GO   Solid: Not tested       Herbie Baltimore, MA CCC-SLP [phone]  Wiley Magan, Katherene Ponto 12/30/2015,10:48 AM

## 2015-12-31 DIAGNOSIS — Z515 Encounter for palliative care: Secondary | ICD-10-CM | POA: Insufficient documentation

## 2015-12-31 DIAGNOSIS — Z7189 Other specified counseling: Secondary | ICD-10-CM | POA: Insufficient documentation

## 2015-12-31 LAB — TYPE AND SCREEN
ABO/RH(D): O POS
ANTIBODY SCREEN: NEGATIVE
UNIT DIVISION: 0
UNIT DIVISION: 0

## 2015-12-31 LAB — CBC
HEMATOCRIT: 32.7 % — AB (ref 36.0–46.0)
Hemoglobin: 10.6 g/dL — ABNORMAL LOW (ref 12.0–15.0)
MCH: 29.1 pg (ref 26.0–34.0)
MCHC: 32.4 g/dL (ref 30.0–36.0)
MCV: 89.8 fL (ref 78.0–100.0)
PLATELETS: 155 10*3/uL (ref 150–400)
RBC: 3.64 MIL/uL — ABNORMAL LOW (ref 3.87–5.11)
RDW: 21.5 % — AB (ref 11.5–15.5)
WBC: 12.1 10*3/uL — AB (ref 4.0–10.5)

## 2015-12-31 LAB — BASIC METABOLIC PANEL
Anion gap: 7 (ref 5–15)
BUN: 55 mg/dL — ABNORMAL HIGH (ref 6–20)
CO2: 13 mmol/L — ABNORMAL LOW (ref 22–32)
Calcium: 7.8 mg/dL — ABNORMAL LOW (ref 8.9–10.3)
Chloride: 123 mmol/L — ABNORMAL HIGH (ref 101–111)
Creatinine, Ser: 1.41 mg/dL — ABNORMAL HIGH (ref 0.44–1.00)
GFR calc Af Amer: 37 mL/min — ABNORMAL LOW (ref >60)
GFR calc non Af Amer: 32 mL/min — ABNORMAL LOW (ref >60)
Glucose, Bld: 65 mg/dL (ref 65–99)
Potassium: 3.2 mmol/L — ABNORMAL LOW (ref 3.5–5.1)
Sodium: 143 mmol/L (ref 135–145)

## 2015-12-31 LAB — LACTIC ACID, PLASMA: Lactic Acid, Venous: 1.2 mmol/L (ref 0.5–2.0)

## 2015-12-31 LAB — MAGNESIUM: Magnesium: 2.1 mg/dL (ref 1.7–2.4)

## 2015-12-31 MED ORDER — POTASSIUM CHLORIDE 2 MEQ/ML IV SOLN: INTRAVENOUS | Status: DC

## 2015-12-31 MED ORDER — VANCOMYCIN HCL IN DEXTROSE 750-5 MG/150ML-% IV SOLN
750.0000 mg | INTRAVENOUS | Status: DC
  Administered 2015-12-31 – 2016-01-02 (×3): 750 mg via INTRAVENOUS
  Filled 2015-12-31 (×4): qty 150

## 2015-12-31 MED ORDER — IMIPENEM-CILASTATIN 500 MG IV SOLR
500.0000 mg | Freq: Two times a day (BID) | INTRAVENOUS | Status: DC
  Administered 2015-12-31 – 2016-01-03 (×6): 500 mg via INTRAVENOUS
  Filled 2015-12-31 (×7): qty 500

## 2015-12-31 MED ORDER — MAGNESIUM SULFATE 2 GM/50ML IV SOLN
2.0000 g | Freq: Once | INTRAVENOUS | Status: AC
  Administered 2015-12-31: 2 g via INTRAVENOUS
  Filled 2015-12-31: qty 50

## 2015-12-31 MED ORDER — POTASSIUM CHLORIDE 10 MEQ/100ML IV SOLN
10.0000 meq | INTRAVENOUS | Status: AC
  Administered 2015-12-31 (×2): 10 meq via INTRAVENOUS
  Filled 2015-12-31 (×2): qty 100

## 2015-12-31 MED ORDER — KCL IN DEXTROSE-NACL 20-5-0.45 MEQ/L-%-% IV SOLN
INTRAVENOUS | Status: DC
  Administered 2015-12-31 – 2016-01-02 (×3): via INTRAVENOUS
  Filled 2015-12-31 (×3): qty 1000

## 2015-12-31 NOTE — Progress Notes (Signed)
SLP Cancellation Note  Patient Details Name: Ande Goewey MRN: [id number] DOB: [birth year]   Cancelled treatment:       Reason Eval/Treat Not Completed: Fatigue/lethargy limiting ability to participate (per RN, pt just received morphine for pain, pt opened her eyes to SLP verbal stimulation - shook her head no and then shook her head yes when I asked if she wanted to be left alone, advised RN to call if pt desires po )   Luanna Salk, [facility] SLP [phone]

## 2015-12-31 NOTE — Progress Notes (Signed)
PROGRESS NOTE                                                                                                                                                                                                             Patient Demographics:    Sandra Flowers, is a 82 y.o. female, DOB - 1924-08-25, TKW:[id number]  Admit date - 12/27/2015   Admitting Physician Jani Gravel, MD  Outpatient Primary MD for the patient is Dasanayaka, Edgar Frisk, MD  LOS - 4  Chief Complaint  Patient presents with  . Altered Mental Status      Brief Narrative   Sandra Flowers is a 82 y.o. F w/ chronic sacral decubitus ulcer who presented to [facility] long ED on 12/27/2015 with decreased responsiveness which has become progressively worse over the last several days. Patient had very little oral intake over the last several days and was found in the ED to have a UTI, worsening sacral ulcer, hypotension, hypernatremia and acute kidney injury.    Subjective:   Continues to be confused and disoriented. Does not follow commands.   Assessment  & Plan :    Active Problems:   Decubitus ulcer, infected   Dehydration   AKI (acute kidney injury) ([place])   Acute encephalopathy   Elevated troponin   Hypokalemia   Hypernatremia   Protein-calorie malnutrition (HCC)   Physical deconditioning   Complicated UTI (urinary tract infection)   Protein-calorie malnutrition, severe   Normocytic anemia   Sepsis Patient met sepsis criteria at admission, fluids and prompt antibiotics given. Lactate normalized.  Started on antibiotics and IV fluids of admission. Lactic acid was 1.8 on admission. Sepsis pathophysiology resolved.  ESBL UTI Urinalysis was positive for UTI on admission, grew 2 organisms, ESBL Klebsiella and Proteus. Continue Primaxin  Acute metabolic encephalopathy: From sepsis and hypernatremia. Dementia at baseline denied by family. State she was  driving and managing finances until fall in January 2017. Still confused today will monitor with treatment of UTI.  Elevated Troponin:  Demand ischemia. Flat trajectory. Echo shows EF 60%, no wall motion abnormalities. -Hold home Aggrenox given mental status, concern for PO meds at present, restart when able  Hypernatremia/hyperchloremia:  149/116 on admission. From dehydration likely secondary to UTI and poor oral intake. Place on D5/1/2 NS on 6/19. Will get a BMP in the AM.  AKI:  Baseline creatinine 0.85. Creatinine 2.35 on admission. Slowly resolving. From dehydation/pre-renal injury and sepsis This is improved with IV fluid hydration. At 1.4 on 6/19. Will continue current management. Check BMP in the A.M.  Hypokalemia:  Repleted on 6/17. 3.2 on 6/19 Check a magnesium level Replete with IV due to confusion. Get BMP in the morning  Chronic sacral decubitus:  Unstageable. Worsening per daughter. Was admitted here in March for infected sacral decub, debrided by Gen Surg, grew M morganii, discharged on Cipro. At that time, there was brief discussion of palliative care with family. -Air overlay -wound care consult -Once electrolytes are improved, and mental status clearer, will obtain General Surgery and palliative medicine consults. -Seen by wound care team and recommended surgical consultation, until we see the palliative medicine team recommendation.  Chronic protein calorie malnutrition, severe: Since January. -Nutrition consult when mental status improves  Anemia , acute on chronic anemia likely from acute illness. -Hemoglobin dropped down to 6.8, status post transfusion of 2 units of packed RBCs. -Has positive Hemoccult, unclear if that the source with unstageable sacral decubitus ulcer can probably affect the positivity of the Hemoccult.  -Hemoglobin is holding at 10.6 today.  -Will continue to monitor with CBC in the morning.    Code Status :   DNR  Family Communication  :  Spoke with the daughter Sharonne Ricketts at bedside  Disposition Plan  :  Stay inpatient  Consults  :  Palliative care  Procedures  :  Echo with normal EF and no wall abnormalities or valve disase  DVT Prophylaxis  : Heparin  Lab Results  Component Value Date   PLT 155 12/31/2015    Inpatient Medications  Scheduled Meds: . Chlorhexidine Gluconate Cloth  6 each Topical Q0600  . heparin  5,000 Units Subcutaneous Q8H  . imipenem-cilastatin  500 mg Intravenous Q12H  . mupirocin ointment  1 application Nasal BID  . sodium chloride  250 mL Intravenous Once  . sodium chloride flush  3 mL Intravenous Q12H  . vancomycin  750 mg Intravenous Q24H   Continuous Infusions: . sodium chloride 500 mL/hr at 12/30/15 2240  . dextrose 5 % and 0.45 % NaCl with KCl 10 mEq/L 100 mL/hr at 12/31/15 0951  . pantoprozole (PROTONIX) infusion 8 mg/hr (12/31/15 0652)   PRN Meds:.acetaminophen **OR** acetaminophen, bisacodyl, morphine injection  Antibiotics  :    Anti-infectives    Start     Dose/Rate Route Frequency Ordered Stop   12/31/15 1200  imipenem-cilastatin (PRIMAXIN) 500 mg in sodium chloride 0.9 % 100 mL IVPB     500 mg 200 mL/hr over 30 Minutes Intravenous Every 12 hours 12/31/15 0916     12/31/15 0930  vancomycin (VANCOCIN) IVPB 750 mg/150 ml premix     750 mg 150 mL/hr over 60 Minutes Intravenous Every 24 hours 12/31/15 0916     12/30/15 2200  imipenem-cilastatin (PRIMAXIN) 500 mg in sodium chloride 0.9 % 100 mL IVPB  Status:  Discontinued     500 mg 200 mL/hr over 30 Minutes Intravenous Every 12 hours 12/30/15 2139 12/30/15 2141   12/30/15 2200  imipenem-cilastatin (PRIMAXIN) 250 mg in sodium chloride 0.9 % 100 mL IVPB  Status:  Discontinued     250 mg 200 mL/hr over 30 Minutes Intravenous Every 8 hours 12/30/15 2142 12/31/15 0916   12/30/15 1400  imipenem-cilastatin (PRIMAXIN) 250 mg in sodium chloride 0.9 % 100 mL IVPB  Status:  Discontinued     250  mg  200 mL/hr over 30 Minutes Intravenous Every 8 hours 12/30/15 1330 12/30/15 2139   12/29/15 1800  vancomycin (VANCOCIN) IVPB 750 mg/150 ml premix  Status:  Discontinued     750 mg 150 mL/hr over 60 Minutes Intravenous Every 48 hours 12/29/15 1305 12/31/15 0916   12/27/15 2200  cefTRIAXone (ROCEPHIN) 1 g in dextrose 5 % 50 mL IVPB  Status:  Discontinued     1 g 100 mL/hr over 30 Minutes Intravenous Every 24 hours 12/27/15 2032 12/30/15 1329   12/27/15 1745  piperacillin-tazobactam (ZOSYN) IVPB 3.375 g     3.375 g 100 mL/hr over 30 Minutes Intravenous  Once 12/27/15 1735 12/27/15 1843   12/27/15 1745  vancomycin (VANCOCIN) IVPB 1000 mg/200 mL premix     1,000 mg 200 mL/hr over 60 Minutes Intravenous  Once 12/27/15 1735 12/27/15 1943         Objective:   Filed Vitals:   12/31/15 0319 12/31/15 0337 12/31/15 0543 12/31/15 0615  BP:   112/70   Pulse:   75   Temp: 94.2 F (34.6 C) 96.5 F (35.8 C) 97 F (36.1 C)   TempSrc: Axillary Temporal Temporal   Resp:   16   Height:      Weight:    68 kg (149 lb 14.6 oz)  SpO2:   95%     Wt Readings from Last 3 Encounters:  12/31/15 68 kg (149 lb 14.6 oz)  09/26/15 72.6 kg (160 lb 0.9 oz)     Intake/Output Summary (Last 24 hours) at 12/31/15 1111 Last data filed at 12/31/15 0953  Gross per 24 hour  Intake 8240.42 ml  Output   1050 ml  Net 7190.42 ml     Physical Exam  Awake Alert, but confused, No new F.N deficits, Normal affect [place].AT,PERRAL Supple Neck,No JVD, No cervical lymphadenopathy appriciated.  Symmetrical Chest wall movement, Good air movement bilaterally, CTAB RRR,No Gallops,Rubs or new Murmurs, No Parasternal Heave +ve B.Sounds, Abd Soft, No tenderness, No organomegaly appriciated, No rebound - guarding or rigidity. No Cyanosis, Clubbing or + peripheral edema, No new Rash or bruise.    Data Review:    CBC  Recent Labs Lab 12/27/15 1432 12/28/15 0838 12/29/15 1949 12/30/15 0855 12/31/15 0512  WBC  27.6* 18.6* 15.3* 15.0* 12.1*  HGB 10.1* 7.9* 6.8* 10.2* 10.6*  HCT 33.5* 26.3* 22.8* 30.9* 32.7*  PLT 355 224 224 PLATELET CLUMPS NOTED ON SMEAR, COUNT APPEARS ADEQUATE 155  MCV 100.3* 100.4* 99.6 88.0 89.8  MCH 30.2 30.2 29.7 29.1 29.1  MCHC 30.1 30.0 29.8* 33.0 32.4  RDW 17.6* 17.5* 17.4* 20.5* 21.5*  LYMPHSABS  --   --   --  0.9  --   MONOABS  --   --   --  0.6  --   EOSABS  --   --   --  0.3  --   BASOSABS  --   --   --  0.0  --     Chemistries   Recent Labs Lab 12/27/15 1432 12/28/15 0838 12/28/15 1240 12/29/15 1949 12/30/15 2310  NA 149* 150* 150* 146* 143  K 3.6 3.4* 3.6 3.5 3.2*  CL 116* 124* 124* 122* 123*  CO2 19* 17* 13* 15* 13*  GLUCOSE 84 75 66 48* 65  BUN 95* 87* 85* 75* 55*  CREATININE 2.35* 2.21* 2.14* 1.78* 1.41*  CALCIUM 8.8* 7.7* 7.9* 7.6* 7.8*  AST 53* 41  --  32  --   ALT 17 16  --  15  --   ALKPHOS 128* 91  --  85  --   BILITOT 0.8 0.7  --  0.5  --    ------------------------------------------------------------------------------------------------------------------ No results for input(s): CHOL, HDL, LDLCALC, TRIG, CHOLHDL, LDLDIRECT in the last 72 hours.  No results found for: HGBA1C ------------------------------------------------------------------------------------------------------------------ No results for input(s): TSH, T4TOTAL, T3FREE, THYROIDAB in the last 72 hours.  Invalid input(s): FREET3 ------------------------------------------------------------------------------------------------------------------ No results for input(s): VITAMINB12, FOLATE, FERRITIN, TIBC, IRON, RETICCTPCT in the last 72 hours.  Coagulation profile No results for input(s): INR, PROTIME in the last 168 hours.  No results for input(s): DDIMER in the last 72 hours.  Cardiac Enzymes  Recent Labs Lab 12/28/15 0838 12/28/15 1240 12/29/15 1949  TROPONINI 0.05* 0.10* 0.03    ------------------------------------------------------------------------------------------------------------------ No results found for: BNP  Micro Results Recent Results (from the past 240 hour(s))  Urine culture     Status: Abnormal   Collection Time: 12/27/15  5:52 PM  Result Value Ref Range Status   Specimen Description URINE, CATHETERIZED  Final   Special Requests NONE  Final   Culture (A)  Final    >=100,000 COLONIES/mL PROTEUS MIRABILIS >=100,000 COLONIES/mL KLEBSIELLA PNEUMONIAE Confirmed Extended Spectrum Beta-Lactamase Producer (ESBL) Performed at [facility]    Report Status 12/30/2015 FINAL  Final   Organism ID, Bacteria PROTEUS MIRABILIS (A)  Final   Organism ID, Bacteria KLEBSIELLA PNEUMONIAE (A)  Final      Susceptibility   Klebsiella pneumoniae - MIC*    AMPICILLIN >=32 RESISTANT Resistant     CEFAZOLIN >=64 RESISTANT Resistant     CEFTRIAXONE >=64 RESISTANT Resistant     CIPROFLOXACIN 1 SENSITIVE Sensitive     GENTAMICIN <=1 SENSITIVE Sensitive     IMIPENEM <=0.25 SENSITIVE Sensitive     NITROFURANTOIN 64 INTERMEDIATE Intermediate     TRIMETH/SULFA >=320 RESISTANT Resistant     AMPICILLIN/SULBACTAM >=32 RESISTANT Resistant     PIP/TAZO 32 INTERMEDIATE Intermediate     * >=100,000 COLONIES/mL KLEBSIELLA PNEUMONIAE   Proteus mirabilis - MIC*    AMPICILLIN <=2 SENSITIVE Sensitive     CEFAZOLIN <=4 SENSITIVE Sensitive     CEFTRIAXONE <=1 SENSITIVE Sensitive     CIPROFLOXACIN >=4 RESISTANT Resistant     GENTAMICIN <=1 SENSITIVE Sensitive     IMIPENEM 2 SENSITIVE Sensitive     NITROFURANTOIN 128 RESISTANT Resistant     TRIMETH/SULFA <=20 SENSITIVE Sensitive     AMPICILLIN/SULBACTAM <=2 SENSITIVE Sensitive     PIP/TAZO <=4 SENSITIVE Sensitive     * >=100,000 COLONIES/mL PROTEUS MIRABILIS  Blood Culture (routine x 2)     Status: None (Preliminary result)   Collection Time: 12/27/15  6:38 PM  Result Value Ref Range Status   Specimen Description BLOOD  LEFT ANTECUBITAL  Final   Special Requests BOTTLES DRAWN AEROBIC AND ANAEROBIC 5CC  Final   Culture   Final    NO GROWTH 3 DAYS Performed at [facility]    Report Status PENDING  Incomplete  Blood Culture (routine x 2)     Status: None (Preliminary result)   Collection Time: 12/27/15  6:38 PM  Result Value Ref Range Status   Specimen Description BLOOD LEFT ANTECUBITAL  Final   Special Requests BOTTLES DRAWN AEROBIC AND ANAEROBIC 5CC  Final   Culture   Final    NO GROWTH 3 DAYS Performed at [facility]    Report Status PENDING  Incomplete  MRSA PCR Screening     Status: Abnormal   Collection Time:  12/27/15  8:30 PM  Result Value Ref Range Status   MRSA by PCR POSITIVE (A) NEGATIVE Final    Comment:        The GeneXpert MRSA Assay (FDA approved for NASAL specimens only), is one component of a comprehensive MRSA colonization surveillance program. It is not intended to diagnose MRSA infection nor to guide or monitor treatment for MRSA infections. RESULT CALLED TO, READ BACK BY AND VERIFIED WITH: Dalbert Garnet RN @ [phone] ON 12/27/15 BY C DAVIS     Radiology Reports US Renal Port  12/27/2015  CLINICAL DATA:  Acute renal failure. EXAM: RENAL / URINARY TRACT ULTRASOUND COMPLETE COMPARISON:  None. FINDINGS: Right Kidney: Length: 9.3 cm. The right least 2 cysts in the right kidney, interpolar region measuring 3.1 x 2.5 x 2.6 cm. Posterior interpolar measures 5.9 x 3.5 x 4.5 cm. Technically limited evaluation due to difficulty with patient positioning. Echogenicity within normal limits. No hydronephrosis visualized. Left Kidney: Length: 10.3 cm. Cyst measures 4.8 x 4.0 x 4.7 cm and may have an internal septation. No hydronephrosis visualized. Bladder: Not evaluated, decompressed by Foley catheter. IMPRESSION: 1. No obstructive uropathy. 2. Bilateral renal cysts. On the left this may have an internal septation. On the right these are difficult to evaluate due to patient  positioning. There are no prior exams for comparison, comparison with any prior imaging would be most helpful. This could be further evaluated with renal protocol CT or MRI versus follow-up ultrasound to evaluate for stability, with consideration given to patient age and personal preference. Electronically Signed   By: Jeb Levering M.D.   On: 12/27/2015 23:49   Dg Chest Port 1 View  12/27/2015  CLINICAL DATA:  Shortness of breath. EXAM: PORTABLE CHEST 1 VIEW COMPARISON:  None. FINDINGS: The heart size and mediastinal contours are within normal limits. Both lungs are clear. No pneumothorax or pleural effusion is noted. Narrowing of the subacromial space is noted bilaterally suggesting rotator cuff injury. IMPRESSION: No acute cardiopulmonary abnormality seen. Electronically Signed   By: Marijo Conception, M.D.   On: 12/27/2015 18:19    Time Spent in minutes  Trooper PA-S on 12/31/2015 at [place] Pager: [phone] 12/31/2015, 1:40 PM

## 2015-12-31 NOTE — Care Management Important Message (Signed)
Important Message  Patient DetailsIM Letter given to Cookie/Case Manager to present to Patient  Name: Sandra Flowers MRN: [id number] Date of Birth: Aug 30, 1924   Medicare Important Message Given:  Yes    Camillo Flaming 12/31/2015, 9:25 AMImportant Message  Patient Details  Name: Sandra Flowers MRN: [id number] Date of Birth: June 25, 1925   Medicare Important Message Given:  Yes    Camillo Flaming 12/31/2015, 9:25 AM

## 2015-12-31 NOTE — Progress Notes (Signed)
No charge note.  I spoke with daughter on the phone 6/19.  She will contact her brother and call me with a time that they are available to meet today.    Dtr asked about the difference between Hospice and Palliative Medicine.   She said, "so there is more hope with Palliative, right?"  Imogene Burn, [place] [facility] Pager: [phone]

## 2015-12-31 NOTE — Progress Notes (Signed)
Pharmacy Antibiotic Note  Sandra Flowers is a 82 y.o. female admitted on 12/27/2015 with sepsis, possibly UTI or decubitus ulcer as source.  Her decubitus ulcers has odor, and may require debridement, but not clear if acutely infected  Pharmacy has been consulted for vancomycin dosing already.  Pharmacy is now consulted to change Ceftriaxone to Imipenem for UTI coverage of ESBL producing organisms.  Today, 12/31/2015   Day #4 antibiotics  Afebrile, WBC elevated but improving   Renal: AKI improving, SCr 1.4 with CrCl ~ 26 ml/min (29 ml/min using TBW)   Plan: Primaxin 500 mg IV q12h Vancomycin 750 mg IV q24h. Measure Vanc trough at steady state. Follow up renal fxn, culture results, and clinical course.   Height: 5\' 7"  (170.2 cm) Weight: 149 lb 14.6 oz (68 kg) IBW/kg (Calculated) : 61.6  Temp (24hrs), Avg:95.6 F (35.3 C), Min:94 F (34.4 C), Max:97.5 F (36.4 C)   Recent Labs Lab 12/27/15 1432 12/27/15 1845 12/28/15 0838 12/28/15 1240 12/29/15 1949 12/30/15 0855 12/30/15 2310 12/31/15 0512  WBC 27.6*  --  18.6*  --  15.3* 15.0*  --  12.1*  CREATININE 2.35*  --  2.21* 2.14* 1.78*  --  1.41*  --   LATICACIDVEN  --  1.81 1.2  --   --   --  1.2  --     Estimated Creatinine Clearance: 26.3 mL/min (by C-G formula based on Cr of 1.41).    No Known Allergies  Antimicrobials this admission: 6/16 >>zosyn x 1 6/16 >> ceftriaxone >> 6/19 6/16 >> vanc >> 6/19 >> Imipenem >>  Dose adjustments this admission:   Microbiology results: 6/16 BCx: NGTD 6/16 UCx: >100k ESBL Klebsiella Pneumo (sens: cipro, gent, imipenem) and > 100k Proteus (pan-sensitive except cipro and NTF) 6/18 MRSA PCR: positive  Thank you for allowing pharmacy to be a part of this patient's care.  Gretta Arab PharmD, BCPS Pager [phone] 12/31/2015 9:13 AM

## 2015-12-31 NOTE — Consult Note (Signed)
Consultation Note Date: 12/31/2015   Patient Name: Sandra Flowers  DOB: 02/11/1925  MRN: [id number]  Age / Sex: 82 y.o., female  PCP: Merlene Laughter, MD Referring Physician: Verlee Monte, MD  Reason for Consultation: Establishing goals of care and Hospice Evaluation  HPI/Patient Profile: 82 y.o. female  with past medical history of meningioma, hearing loss, chronic wounds and dementia who was admitted on 12/27/2015 with sepsis, altered mental status, acute renal failure, UTI, and worsening anemia.  She was found to have 2 resistant bacteria growing in her urine and is now on vancomycin and primaxin, wound care has recommended surgical consultation for a second  potential wound debridment.  The patient has H+ stool with worsening anemia and has received 2 units of blood this admission.  She also received 2 units of PRBCs in 09/2015.  She had a creatinine peak of 2.35 which has improved with hydration to 1.4.  Hypernatremia and AKI were felt to be due to poor PO intake and dehydration.  The patient's mental status has not improved enough in the past 4 days to do a speech evaluation.  Clinical Assessment and Goals of Care: I was scheduled to meet with the patient's daughter and two sons at 4 pm on 6/20.  Unfortunately after 3 pm the daughter cancelled the meeting as she had a conference call with the [place] at 3:30 to try to arrange for spousal benefits for her mother.  We are now scheduled to meet at 4 pm on 6/21.  I assessed Sandra Flowers.  I believe she is dying.  She is no longer taking significant amounts of PO intake; she is making very little urine; she has MDROs in her urine that caused sepsis; she is altered.  Her wounds are many, severe, and non-healing.  She is terribly malnourished with an albumin of 1.7.  My best recommendation to her children will be for residential hospice services.    HCPOA:  Erin Hearing, daughter    SUMMARY OF RECOMMENDATIONS    DNR / DNI  Residential Hospice  I will plan to meet with her children tomorrow, explain their mother's situation and determine if they are accepting of the recommendation.   Code Status/Advance Care Planning:  DNR    Symptom Management:   Continue pain medications as they are currently being given.  Palliative wound care  Palliative Prophylaxis:   Frequent Pain Assessment, Palliative Wound Care and Turn Reposition  Additional Recommendations  Full Comfort Care   Prognosis:   < 2 weeks given poor nutritional status (albumin 1.7), multiple non healing wounds, ESBL/antibiotic resistant UTIs, recurrent anemia requiring blood transfusions and very poor PO intake causing acute kidney injury and hypernatremia.  Discharge Planning: Hospice facility      Primary Diagnoses: Present on Admission:  . Dehydration . (Resolved) Hypotension . (Resolved) UTI (lower urinary tract infection) . (Resolved) Dementia . Decubitus ulcer, infected . AKI (acute kidney injury) ([place]) . Acute encephalopathy . Elevated troponin . Hypokalemia . Hypernatremia . Protein-calorie malnutrition ([place]) . Physical  deconditioning . Complicated UTI (urinary tract infection) . Protein-calorie malnutrition, severe . Normocytic anemia  I have reviewed the medical record, interviewed the patient and family, and examined the patient. The following aspects are pertinent.  Past Medical History  Diagnosis Date  . Hypertension   . Hyperlipidemia   . Meningioma ([place]) y-10  . Arthritis   . Hearing loss m-6  . Dementia    Social History   Social History  . Marital Status: Unknown    Spouse Name: N/A  . Number of Children: N/A  . Years of Education: N/A   Social History Main Topics  . Smoking status: Never Smoker   . Smokeless tobacco: Never Used  . Alcohol Use: No  . Drug Use: No  . Sexual Activity: No   Other Topics Concern  . None    Social History Narrative   Family History  Problem Relation Age of Onset  . Heart disease Mother   . Hypertension Son   . Cancer Maternal Aunt     lung  . Cancer Paternal Aunt     liver  . Heart disease Father    Scheduled Meds: . Chlorhexidine Gluconate Cloth  6 each Topical Q0600  . heparin  5,000 Units Subcutaneous Q8H  . imipenem-cilastatin  500 mg Intravenous Q12H  . mupirocin ointment  1 application Nasal BID  . sodium chloride  250 mL Intravenous Once  . sodium chloride flush  3 mL Intravenous Q12H  . vancomycin  750 mg Intravenous Q24H   Continuous Infusions: . dextrose 5 % and 0.45 % NaCl with KCl 20 mEq/L 75 mL/hr at 12/31/15 1616  . pantoprozole (PROTONIX) infusion 8 mg/hr (12/31/15 1621)   PRN Meds:.acetaminophen **OR** acetaminophen, bisacodyl, morphine injection Medications Prior to Admission:  Prior to Admission medications   Medication Sig Start Date End Date Taking? Authorizing Provider  Amino Acids-Protein Hydrolys (FEEDING SUPPLEMENT, PRO-STAT SUGAR FREE 64,) LIQD Take 30 mLs by mouth 3 (three) times daily. 10/01/15  Yes Robbie Lis, MD  amoxicillin-clavulanate (AUGMENTIN) 875-125 MG tablet Take 1 tablet by mouth 2 (two) times daily. Starting 12/27/15 for 7 days.   Yes Historical Provider, MD  cetirizine (ZYRTEC) 10 MG tablet Take 10 mg by mouth daily.   Yes Historical Provider, MD  collagenase (SANTYL) ointment Apply 1 application topically every evening. To left calf and R ischium.   Yes Historical Provider, MD  dipyridamole-aspirin (AGGRENOX) 200-25 MG 12hr capsule Take 1 capsule by mouth 2 (two) times daily.   Yes Historical Provider, MD  ferrous sulfate 325 (65 FE) MG tablet Take 1 tablet (325 mg total) by mouth daily with breakfast. 10/01/15  Yes Robbie Lis, MD  Menthol-Methyl Salicylate (BENGAY GREASELESS EX) Apply 11 application topically 2 (two) times daily. To R hip.   Yes Historical Provider, MD  mirtazapine (REMERON) 7.5 MG tablet Take 7.5 mg  by mouth at bedtime.   Yes Historical Provider, MD  Multiple Vitamin (MULTIVITAMIN WITH MINERALS) TABS tablet Take 1 tablet by mouth daily.   Yes Historical Provider, MD  oxyCODONE (OXYCONTIN) 10 mg 12 hr tablet Take 10 mg by mouth every 12 (twelve) hours.   Yes Historical Provider, MD  oxyCODONE-acetaminophen (PERCOCET/ROXICET) 5-325 MG tablet Take 1-2 tablets by mouth every 6 (six) hours as needed for moderate pain or severe pain. 10/03/15  Yes Modena Jansky, MD  Polyethyl Glycol-Propyl Glycol (SYSTANE) 0.4-0.3 % GEL ophthalmic gel Place 1 application into both eyes daily as needed (dry eye syndrome.).  Yes Historical Provider, MD  polyvinyl alcohol (LIQUIFILM TEARS) 1.4 % ophthalmic solution Place 1 drop into both eyes as needed for dry eyes.   Yes Historical Provider, MD  saccharomyces boulardii (FLORASTOR) 250 MG capsule Take 250 mg by mouth 2 (two) times daily.   Yes Historical Provider, MD  senna-docusate (SENOKOT-S) 8.6-50 MG tablet Take 2 tablets by mouth at bedtime.   Yes Historical Provider, MD  vitamin C (ASCORBIC ACID) 500 MG tablet Take 500 mg by mouth daily.   Yes Historical Provider, MD   No Known Allergies Review of Systems Patient unable to provide due to altered mental status and dementia.  Physical Exam  Elderly frail female who repeats the phrase "just pull it" in answer to all of my questions. CV faint but regular Resp NAD Abdomen:  Soft Skin:  Sacral and ischial wounds visualized.  The sacral wound is approximately 12" in diameter and to the bone.  There is currently black liquid stool in the area surrounding it.  Vital Signs: BP 108/68 mmHg  Pulse 78  Temp(Src) 97.2 F (36.2 C) (Axillary)  Resp 16  Ht 5\' 7"  (1.702 m)  Wt 68 kg (149 lb 14.6 oz)  BMI 23.47 kg/m2  SpO2 94% Pain Assessment: No/denies pain POSS *See Group Information*: 1-Acceptable,Awake and alert Pain Score: 0-No pain   SpO2: SpO2: 94 % O2 Device:SpO2: 94 % O2 Flow Rate: .O2 Flow Rate  (L/min): 3 L/min  IO: Intake/output summary:   Intake/Output Summary (Last 24 hours) at 12/31/15 1733 Last data filed at 12/31/15 1544  Gross per 24 hour  Intake 8192.92 ml  Output   1300 ml  Net 6892.92 ml    LBM: Last BM Date: 12/30/15 Baseline Weight: Weight: 58.65 kg (129 lb 4.8 oz) Most recent weight: Weight: 68 kg (149 lb 14.6 oz)     Palliative Assessment/Data:   Flowsheet Rows        Most Recent Value   Intake Tab    Referral Department  Hospitalist   Unit at Time of Referral  Med/Surg Unit   Palliative Care Primary Diagnosis  Other (Comment)   Date Notified  12/30/15   Palliative Care Type  New Palliative care   Reason for referral  Clarify Goals of Care   Date of Admission  12/27/15   Date first seen by Palliative Care  12/31/15   # of days Palliative referral response time  1 Day(s)   # of days IP prior to Palliative referral  3   Clinical Assessment    Palliative Performance Scale Score  10%   Psychosocial & Spiritual Assessment    Palliative Care Outcomes    Patient/Family meeting held?  No      Time In: 3:30 pm Time Out: 4:40 pm Time Total: 70 Greater than 50%  of this time was spent counseling and coordinating care related to the above assessment and plan.  Talked with daughter over the phone twice today, spoke with bedside RN and attending [facility] physician.  Signed by: Imogene Burn, PA-C Palliative Medicine Pager: [phone]   Please contact Palliative Medicine Team phone at [phone] for questions and concerns.  For individual provider: See Shea Evans

## 2016-01-01 DIAGNOSIS — Z515 Encounter for palliative care: Secondary | ICD-10-CM | POA: Insufficient documentation

## 2016-01-01 DIAGNOSIS — N179 Acute kidney failure, unspecified: Secondary | ICD-10-CM

## 2016-01-01 DIAGNOSIS — I9589 Other hypotension: Secondary | ICD-10-CM

## 2016-01-01 DIAGNOSIS — E870 Hyperosmolality and hypernatremia: Secondary | ICD-10-CM

## 2016-01-01 DIAGNOSIS — L8995 Pressure ulcer of unspecified site, unstageable: Secondary | ICD-10-CM

## 2016-01-01 DIAGNOSIS — Z1612 Extended spectrum beta lactamase (ESBL) resistance: Secondary | ICD-10-CM

## 2016-01-01 DIAGNOSIS — E876 Hypokalemia: Secondary | ICD-10-CM

## 2016-01-01 DIAGNOSIS — A499 Bacterial infection, unspecified: Secondary | ICD-10-CM

## 2016-01-01 DIAGNOSIS — G9340 Encephalopathy, unspecified: Secondary | ICD-10-CM

## 2016-01-01 DIAGNOSIS — N390 Urinary tract infection, site not specified: Secondary | ICD-10-CM

## 2016-01-01 DIAGNOSIS — E860 Dehydration: Secondary | ICD-10-CM

## 2016-01-01 DIAGNOSIS — F0391 Unspecified dementia with behavioral disturbance: Secondary | ICD-10-CM

## 2016-01-01 DIAGNOSIS — D649 Anemia, unspecified: Secondary | ICD-10-CM

## 2016-01-01 DIAGNOSIS — E46 Unspecified protein-calorie malnutrition: Secondary | ICD-10-CM

## 2016-01-01 LAB — BASIC METABOLIC PANEL
Anion gap: 4 — ABNORMAL LOW (ref 5–15)
BUN: 47 mg/dL — AB (ref 6–20)
CALCIUM: 7.9 mg/dL — AB (ref 8.9–10.3)
CHLORIDE: 125 mmol/L — AB (ref 101–111)
CO2: 14 mmol/L — ABNORMAL LOW (ref 22–32)
CREATININE: 1.4 mg/dL — AB (ref 0.44–1.00)
GFR calc Af Amer: 37 mL/min — ABNORMAL LOW (ref >60)
GFR, EST NON AFRICAN AMERICAN: 32 mL/min — AB (ref >60)
Glucose, Bld: 88 mg/dL (ref 65–99)
Potassium: 3.6 mmol/L (ref 3.5–5.1)
SODIUM: 143 mmol/L (ref 135–145)

## 2016-01-01 LAB — CULTURE, BLOOD (ROUTINE X 2)
CULTURE: NO GROWTH
CULTURE: NO GROWTH

## 2016-01-01 LAB — CBC
HEMATOCRIT: 30.4 % — AB (ref 36.0–46.0)
HEMOGLOBIN: 10 g/dL — AB (ref 12.0–15.0)
MCH: 29.4 pg (ref 26.0–34.0)
MCHC: 32.9 g/dL (ref 30.0–36.0)
MCV: 89.4 fL (ref 78.0–100.0)
PLATELETS: 132 10*3/uL — AB (ref 150–400)
RBC: 3.4 MIL/uL — AB (ref 3.87–5.11)
RDW: 21.5 % — AB (ref 11.5–15.5)
WBC: 10.4 10*3/uL (ref 4.0–10.5)

## 2016-01-01 MED ORDER — POLYVINYL ALCOHOL 1.4 % OP SOLN
1.0000 [drp] | Freq: Four times a day (QID) | OPHTHALMIC | Status: DC | PRN
  Filled 2016-01-01: qty 15

## 2016-01-01 MED ORDER — GLYCOPYRROLATE 0.2 MG/ML IJ SOLN
0.2000 mg | INTRAMUSCULAR | Status: DC | PRN
  Filled 2016-01-01: qty 1

## 2016-01-01 MED ORDER — HALOPERIDOL LACTATE 2 MG/ML PO CONC
0.5000 mg | ORAL | Status: DC | PRN
  Filled 2016-01-01: qty 0.3

## 2016-01-01 MED ORDER — HALOPERIDOL 0.5 MG PO TABS
0.5000 mg | ORAL_TABLET | ORAL | Status: DC | PRN
  Filled 2016-01-01: qty 1

## 2016-01-01 MED ORDER — POTASSIUM CHLORIDE 10 MEQ/100ML IV SOLN
10.0000 meq | INTRAVENOUS | Status: AC
  Administered 2016-01-01 (×3): 10 meq via INTRAVENOUS
  Filled 2016-01-01 (×3): qty 100

## 2016-01-01 MED ORDER — HALOPERIDOL LACTATE 5 MG/ML IJ SOLN: 0.5000 mg | INTRAMUSCULAR | Status: DC | PRN

## 2016-01-01 MED ORDER — ONDANSETRON HCL 4 MG/2ML IJ SOLN: 4.0000 mg | Freq: Four times a day (QID) | INTRAMUSCULAR | Status: DC | PRN

## 2016-01-01 MED ORDER — ONDANSETRON 4 MG PO TBDP: 4.0000 mg | ORAL_TABLET | Freq: Four times a day (QID) | ORAL | Status: DC | PRN

## 2016-01-01 MED ORDER — BIOTENE DRY MOUTH MT LIQD: 15.0000 mL | OROMUCOSAL | Status: DC | PRN

## 2016-01-01 MED ORDER — GLYCOPYRROLATE 1 MG PO TABS
1.0000 mg | ORAL_TABLET | ORAL | Status: DC | PRN
  Filled 2016-01-01: qty 1

## 2016-01-01 MED ORDER — PANTOPRAZOLE SODIUM 40 MG IV SOLR
40.0000 mg | INTRAVENOUS | Status: DC
  Administered 2016-01-02: 40 mg via INTRAVENOUS
  Filled 2016-01-01 (×2): qty 40

## 2016-01-01 NOTE — Progress Notes (Signed)
Daily Progress Note   Patient Name: Sandra Flowers       Date: 01/01/2016 DOB: 02-15-1925  Age: 82 y.o. MRN#: [id number] Attending Physician: Charlynne Cousins, MD Primary Care Physician: Merlene Laughter, MD Admit Date: 12/27/2015  Reason for Consultation/Follow-up: Establishing goals of care and Hospice Evaluation  Subjective: I met with the patient's family at bedside.  Her daughter Sandra Flowers, sons Sandra Flowers, and Sandra Flowers (on the phone).  Sandra Flowers was a lovely independent strong woman.  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  She mentored many young men and women.  Unfortunately beginning last December she started to suffer a steep decline.  She began falling, and ended up with a sacral wound.  She moved into [facility] where the wound became worse.  She developed dementia but was still able to compensate for it.  In March she underwent surgical wound debridment.  She became less mobile and developed recurrent UTIs.  Her nutritional status suffered as she was eating and drinking less.  She became depressed.  More recently her wounds became much worse. She has been bed bound for several months.  I met with her family today and explained this to them.  They want to provide her with comfort and dignity.  They do not want her to return to SNF.  Given her current situation with minimal PO intake, non healable wounds, kidney failure and MDRO UTI - they understand she will not improve.  They support transferring her to residential Hospice when her antibiotics are complete. They request [facility] as her two local children are  here in [place].  Length of Stay: 5  Current Medications: Scheduled Meds:  . imipenem-cilastatin  500 mg Intravenous Q12H  . pantoprazole (PROTONIX) IV  40 mg Intravenous Q24H  . sodium chloride  250 mL Intravenous Once  . sodium chloride flush  3 mL Intravenous Q12H  . vancomycin  750 mg Intravenous Q24H    Continuous Infusions: . dextrose 5 % and 0.45 % NaCl with KCl 20 mEq/L 75 mL/hr at 01/01/16 0552    PRN Meds: acetaminophen **OR** acetaminophen, antiseptic oral rinse, bisacodyl,  glycopyrrolate **OR** glycopyrrolate **OR** glycopyrrolate, haloperidol **OR** haloperidol **OR** haloperidol lactate, morphine injection, ondansetron **OR** ondansetron (ZOFRAN) IV, polyvinyl alcohol  Physical Exam     Frail elderly female.  Pleasantly demented.  Awake but not orientated CV rrr Resp NAD Abdomen: soft Skin multiple wounds on sacrum, ischium, calf, ankle, heels      Vital Signs: BP 101/60 mmHg  Pulse 101  Temp(Src) 97.9 F (36.6 C) (Axillary)  Resp 20  Ht 5' 7"  (1.702 m)  Wt 72.349 kg (159 lb 8 oz)  BMI 24.98 kg/m2  SpO2 94% SpO2: SpO2: 94 % O2 Device: O2 Device: Not Delivered O2 Flow Rate: O2 Flow Rate (L/min): 3 L/min  Intake/output summary:  Intake/Output Summary (Last 24 hours) at 01/01/16 1716 Last data filed at 01/01/16 1458  Gross per 24 hour  Intake   2355 ml  Output   1025 ml  Net   1330 ml   LBM: Last BM Date: 12/31/15 Baseline Weight: Weight: 58.65 kg (129 lb 4.8 oz) Most recent weight: Weight: 72.349 kg (159 lb 8 oz)       Palliative Assessment/Data:    Flowsheet Rows        Most Recent Value   Intake Tab    Referral Department  Hospitalist   Unit at Time of Referral  Med/Surg Unit   Palliative Care Primary Diagnosis  Other (Comment)   Date Notified  12/30/15   Palliative Care Type  New Palliative care   Reason for referral  Clarify Goals of Care   Date of Admission  12/27/15   Date first seen by Palliative Care  12/31/15   # of days  Palliative referral response time  1 Day(s)   # of days IP prior to Palliative referral  3   Clinical Assessment    Palliative Performance Scale Score  10%   Psychosocial & Spiritual Assessment    Palliative Care Outcomes    Patient/Family meeting held?  No      Patient Active Problem List   Diagnosis Date Noted  . Hospice care   . Palliative care encounter   . Goals of care, counseling/discussion   . Encounter for hospice care discussion   . Protein-calorie malnutrition, severe 12/29/2015  . Normocytic anemia 12/29/2015  . AKI (acute kidney injury) ([place]) 12/28/2015  . Acute encephalopathy 12/28/2015  . Elevated troponin 12/28/2015  . Hypokalemia 12/28/2015  . Hypernatremia 12/28/2015  . Protein-calorie malnutrition ([place])   . Physical deconditioning   . Complicated UTI (urinary tract infection)   . Dehydration 12/27/2015  . Acute renal failure (ARF) ([place]) 12/27/2015  . Decubitus ulcer, infected 09/26/2015  . Meningioma ([place])   . Hearing loss     Palliative Care Assessment & Plan   Patient Profile: 82 y.o. female with past medical history of meningioma, hearing loss, chronic wounds and dementia who was admitted on 12/27/2015 with sepsis, altered mental status, acute renal failure, UTI, and worsening anemia. She was found to have 2 resistant bacteria growing in her urine and is now on vancomycin and primaxin, wound care has recommended surgical consultation for a second potential wound debridment. The patient has H+ stool with worsening anemia and has received 2 units of blood this admission. She also received 2 units of PRBCs in 09/2015. She had a creatinine peak of 2.35 which has improved with hydration to 1.4. Hypernatremia and AKI were felt to be due to poor PO intake and dehydration. The patient's mental status has not improved enough in the past  4 days to do a speech evaluation.  Assessment: Sandra Flowers is in the process of dying.  Her family accepts this and  requests residential hospice in [place].  Recommendations/Plan:  DNR  Complete antibiotic course  Comfort measures  Residential Hospice  [facility]) is uncomfortable using Morphine.  Goals of Care and Additional Recommendations:  Limitations on Scope of Treatment: Full Comfort Care and Initiate Comfort Feeding  Code Status:  DNR Prognosis:   < 2 weeks patient is no longer eating or drinking.  She has severe non-healing wounds, acute kidney injury, and recurrent drug resistant UTIs.  She is bed bound with advanced dementia.  Discharge Planning:  Hospice facility  Care plan was discussed with family, bedside RN  Thank you for allowing the Palliative Medicine Team to assist in the care of this patient.   Time In: 4:00 Time Out: 5:00 Total Time 70 Prolonged Time Billed yes      Greater than 50%  of this time was spent counseling and coordinating care related to the above assessment and plan.  Imogene Burn, PA-C Palliative Medicine Pager: [phone]   Please contact Palliative Medicine Team phone at [phone] for questions and concerns.

## 2016-01-01 NOTE — Progress Notes (Signed)
Note plan for palliative meeting today at 4 pm with family and SLP reviewed Imogene Burn, [place] note.  If pt becomes comfort care, would recommend comfort feeds. Will follow up to determine if may be helpful to pt care plan.  Luanna Salk, [facility] SLP [phone]

## 2016-01-01 NOTE — Progress Notes (Signed)
[facility] PROGRESS NOTE    Progress Note  Sandra Flowers  [id number] DOB: 1924-08-16 DOA: 12/27/2015 PCP: Merlene Laughter, MD     Brief Narrative:   Sandra Flowers is an 82 y.o. female history of chronic sacral decubitus ulcer who presented to the [facility] on 12/27/2015 with decreased responsiveness over the last several days, poor oral intake and was found to have ESBL UTI, hypotension hyponatremia acute renal failure due to sepsis  Assessment/Plan:   Sepsis likely due ESBL UTI: Patient was aggressively fluid resuscitated admission with prompt antibiotics and IV fluids. Her sepsis physiology has resolved. Urine culture grew ESBL Klebsiella and Proteus and she is currently on Primaxin. Sepsis has resolved. She still confused as she has had poor oral intake shows seborrheic poor prognosis. Awaiting palliative care to meet with family they are recommending Towards comfort care preferably at a residential hospice facility.  Acute metabolic encephalopathy: Multifactorial likely due to sepsis and hypernatremia, overlying baseline dementia. As per family members she was managing her finances and driving until January 2017, which seems unlikely. The patient continues to be severely confused and unable to carry on a conversation.  Acute kidney injury: Likely due to sepsis on admission her creatinine was 2.3. Heart baseline is around 0.8, creatinine continues to improve slowly.  Hyperkalemia: It was repleted now resolved, magnesium is 2.1. We'll continue to give IV potassium  Chronic  Decubitus ulcer, infected: He admitted on March 2017 for an infected sacral decubitus ulcer debridement performed by general surgery D/c on  Cipro. Continue overlay mattress, wound care.  Chronic protein caloric malnutrition severe: Patient is currently nothing by mouth due to poor mentation.  Normocytic anemia likely due to chronic disease: A hemoglobin drop to 6.8 she is status  post 2 units of packed grip blood cells. Clinical Hemoccult is positive, but she is not a candidate for any intervention at this point. Went to monitor CBCs.  Hyponatremia likely due to sepsis:  Resolved with IV fluid hydration.  Physical deconditioning   DVT prophylaxis: DNR Family Communication:none Disposition Plan/Barrier to D/C: facility Code Status:     Code Status Orders        Start     Ordered   12/27/15 2033  Do not attempt resuscitation (DNR)   Continuous    Question Answer Comment  In the event of cardiac or respiratory ARREST Do not call a "code blue"   In the event of cardiac or respiratory ARREST Do not perform Intubation, CPR, defibrillation or ACLS   In the event of cardiac or respiratory ARREST Use medication by any route, position, wound care, and other measures to relive pain and suffering. May use oxygen, suction and manual treatment of airway obstruction as needed for comfort.      12/27/15 2032    Code Status History    Date Active Date Inactive Code Status Order ID Comments User Context   12/27/2015  5:35 PM 12/27/2015  8:32 PM DNR [id number]  Fredia Sorrow, MD ED   10/03/2015  1:54 PM 10/03/2015  7:01 PM DNR [id number]  Modena Jansky, MD Inpatient   09/26/2015  6:29 PM 10/03/2015  1:54 PM Full Code [id number]  Leana Gamer, MD Inpatient    Advance Directive Documentation        Most Recent Value   Type of Advance Directive  Healthcare Power of Attorney, Living will   Pre-existing out of facility DNR order (yellow form or pink MOST form)     "  MOST" Form in Place?          IV Access:    Peripheral IV   Procedures and diagnostic studies:   No results found.   Medical Consultants:    None.  Anti-Infectives:   Vancomycin and Primaxin.  Subjective:    Sandra Flowers patient is confused probably responsive to voice no able to carry on a conversation.  Objective:    Filed Vitals:   12/31/15 0615 12/31/15 1500 12/31/15 2110  01/01/16 0538  BP:  108/68 108/84 92/61  Pulse:  78 108 110  Temp:  97.2 F (36.2 C) 97.5 F (36.4 C) 98.1 F (36.7 C)  TempSrc:  Axillary Oral Axillary  Resp:  16 20 20   Height:      Weight: 68 kg (149 lb 14.6 oz)   72.349 kg (159 lb 8 oz)  SpO2:  94% 100% 96%    Intake/Output Summary (Last 24 hours) at 01/01/16 1011 Last data filed at 01/01/16 0700  Gross per 24 hour  Intake 2803.33 ml  Output    775 ml  Net 2028.33 ml   Filed Weights   12/30/15 0310 12/31/15 0615 01/01/16 0538  Weight: 67.903 kg (149 lb 11.2 oz) 68 kg (149 lb 14.6 oz) 72.349 kg (159 lb 8 oz)    Exam: General exam: In no acute distress.Cachectic appearing severely malnourished Respiratory system: Good air movement and clear to auscultation. Cardiovascular system: S1 & S2 heard, RRR. No JVD. Gastrointestinal system: Abdomen is nondistended, soft and nontender.  Central nervous system: Alert and oriented. No focal neurological deficits. Extremities: No pedal edema. Skin: No rashes, lesions or ulcers Psychiatry: Judgement and insight appear normal. Mood & affect appropriate.    Data Reviewed:    Labs: Basic Metabolic Panel:  Recent Labs Lab 12/28/15 0838 12/28/15 1240 12/29/15 1949 12/30/15 2310 12/31/15 0512 01/01/16 0516  NA 150* 150* 146* 143  --  143  K 3.4* 3.6 3.5 3.2*  --  3.6  CL 124* 124* 122* 123*  --  125*  CO2 17* 13* 15* 13*  --  14*  GLUCOSE 75 66 48* 65  --  88  BUN 87* 85* 75* 55*  --  47*  CREATININE 2.21* 2.14* 1.78* 1.41*  --  1.40*  CALCIUM 7.7* 7.9* 7.6* 7.8*  --  7.9*  MG  --   --   --   --  2.1  --    GFR Estimated Creatinine Clearance: 26.5 mL/min (by C-G formula based on Cr of 1.4). Liver Function Tests:  Recent Labs Lab 12/27/15 1432 12/28/15 0838 12/29/15 1949  AST 53* 41 32  ALT 17 16 15   ALKPHOS 128* 91 85  BILITOT 0.8 0.7 0.5  PROT 6.9 5.2* 5.0*  ALBUMIN 2.2* 1.7* 1.7*   No results for input(s): LIPASE, AMYLASE in the last 168 hours. No results  for input(s): AMMONIA in the last 168 hours. Coagulation profile No results for input(s): INR, PROTIME in the last 168 hours.  CBC:  Recent Labs Lab 12/28/15 0838 12/29/15 1949 12/30/15 0855 12/31/15 0512 01/01/16 0516  WBC 18.6* 15.3* 15.0* 12.1* 10.4  NEUTROABS  --   --  13.2*  --   --   HGB 7.9* 6.8* 10.2* 10.6* 10.0*  HCT 26.3* 22.8* 30.9* 32.7* 30.4*  MCV 100.4* 99.6 88.0 89.8 89.4  PLT 224 224 PLATELET CLUMPS NOTED ON SMEAR, COUNT APPEARS ADEQUATE 155 132*   Cardiac Enzymes:  Recent Labs Lab 12/27/15 2250 12/28/15 0838 12/28/15 1240  12/29/15 1949  TROPONINI 0.06* 0.05* 0.10* 0.03   BNP (last 3 results) No results for input(s): PROBNP in the last 8760 hours. CBG:  Recent Labs Lab 12/27/15 1413  GLUCAP 75   D-Dimer: No results for input(s): DDIMER in the last 72 hours. Hgb A1c: No results for input(s): HGBA1C in the last 72 hours. Lipid Profile: No results for input(s): CHOL, HDL, LDLCALC, TRIG, CHOLHDL, LDLDIRECT in the last 72 hours. Thyroid function studies: No results for input(s): TSH, T4TOTAL, T3FREE, THYROIDAB in the last 72 hours.  Invalid input(s): FREET3 Anemia work up: No results for input(s): VITAMINB12, FOLATE, FERRITIN, TIBC, IRON, RETICCTPCT in the last 72 hours. Sepsis Labs:  Recent Labs Lab 12/27/15 1845 12/28/15 [id number] 12/29/15 1949 12/30/15 0855 12/30/15 2310 12/31/15 0512 01/01/16 0516  WBC  --  18.6* 15.3* 15.0*  --  12.1* 10.4  LATICACIDVEN 1.81 1.2  --   --  1.2  --   --    Microbiology Recent Results (from the past 240 hour(s))  Urine culture     Status: Abnormal   Collection Time: 12/27/15  5:52 PM  Result Value Ref Range Status   Specimen Description URINE, CATHETERIZED  Final   Special Requests NONE  Final   Culture (A)  Final    >=100,000 COLONIES/mL PROTEUS MIRABILIS >=100,000 COLONIES/mL KLEBSIELLA PNEUMONIAE Confirmed Extended Spectrum Beta-Lactamase Producer (ESBL) Performed at [facility]     Report Status 12/30/2015 FINAL  Final   Organism ID, Bacteria PROTEUS MIRABILIS (A)  Final   Organism ID, Bacteria KLEBSIELLA PNEUMONIAE (A)  Final      Susceptibility   Klebsiella pneumoniae - MIC*    AMPICILLIN >=32 RESISTANT Resistant     CEFAZOLIN >=64 RESISTANT Resistant     CEFTRIAXONE >=64 RESISTANT Resistant     CIPROFLOXACIN 1 SENSITIVE Sensitive     GENTAMICIN <=1 SENSITIVE Sensitive     IMIPENEM <=0.25 SENSITIVE Sensitive     NITROFURANTOIN 64 INTERMEDIATE Intermediate     TRIMETH/SULFA >=320 RESISTANT Resistant     AMPICILLIN/SULBACTAM >=32 RESISTANT Resistant     PIP/TAZO 32 INTERMEDIATE Intermediate     * >=100,000 COLONIES/mL KLEBSIELLA PNEUMONIAE   Proteus mirabilis - MIC*    AMPICILLIN <=2 SENSITIVE Sensitive     CEFAZOLIN <=4 SENSITIVE Sensitive     CEFTRIAXONE <=1 SENSITIVE Sensitive     CIPROFLOXACIN >=4 RESISTANT Resistant     GENTAMICIN <=1 SENSITIVE Sensitive     IMIPENEM 2 SENSITIVE Sensitive     NITROFURANTOIN 128 RESISTANT Resistant     TRIMETH/SULFA <=20 SENSITIVE Sensitive     AMPICILLIN/SULBACTAM <=2 SENSITIVE Sensitive     PIP/TAZO <=4 SENSITIVE Sensitive     * >=100,000 COLONIES/mL PROTEUS MIRABILIS  Blood Culture (routine x 2)     Status: None (Preliminary result)   Collection Time: 12/27/15  6:38 PM  Result Value Ref Range Status   Specimen Description BLOOD LEFT ANTECUBITAL  Final   Special Requests BOTTLES DRAWN AEROBIC AND ANAEROBIC 5CC  Final   Culture   Final    NO GROWTH 4 DAYS Performed at [facility]    Report Status PENDING  Incomplete  Blood Culture (routine x 2)     Status: None (Preliminary result)   Collection Time: 12/27/15  6:38 PM  Result Value Ref Range Status   Specimen Description BLOOD LEFT ANTECUBITAL  Final   Special Requests BOTTLES DRAWN AEROBIC AND ANAEROBIC 5CC  Final   Culture   Final    NO GROWTH 4  DAYS Performed at [facility]    Report Status PENDING  Incomplete  MRSA PCR Screening      Status: Abnormal   Collection Time: 12/27/15  8:30 PM  Result Value Ref Range Status   MRSA by PCR POSITIVE (A) NEGATIVE Final    Comment:        The GeneXpert MRSA Assay (FDA approved for NASAL specimens only), is one component of a comprehensive MRSA colonization surveillance program. It is not intended to diagnose MRSA infection nor to guide or monitor treatment for MRSA infections. RESULT CALLED TO, READ BACK BY AND VERIFIED WITH: L VERGELDEDIOS RN @ [id number] ON 12/27/15 BY C DAVIS      Medications:   . heparin  5,000 Units Subcutaneous Q8H  . imipenem-cilastatin  500 mg Intravenous Q12H  . sodium chloride  250 mL Intravenous Once  . sodium chloride flush  3 mL Intravenous Q12H  . vancomycin  750 mg Intravenous Q24H   Continuous Infusions: . dextrose 5 % and 0.45 % NaCl with KCl 20 mEq/L 75 mL/hr at 01/01/16 0552  . pantoprozole (PROTONIX) infusion 8 mg/hr (01/01/16 0152)    Time spent: 25 min   LOS: 5 days   Charlynne Cousins  [facility] Pager [phone]  *Please refer to [email or web address], password [email or web address] to get updated schedule on who will round on this patient, as hospitalists switch teams weekly. If 7PM-7AM, please contact night-coverage at [email or web address], password [email or web address] for any overnight needs.  01/01/2016, 10:11 AM

## 2016-01-02 DIAGNOSIS — F0390 Unspecified dementia without behavioral disturbance: Secondary | ICD-10-CM

## 2016-01-02 DIAGNOSIS — E43 Unspecified severe protein-calorie malnutrition: Secondary | ICD-10-CM

## 2016-01-02 DIAGNOSIS — R5381 Other malaise: Secondary | ICD-10-CM

## 2016-01-02 DIAGNOSIS — R7989 Other specified abnormal findings of blood chemistry: Secondary | ICD-10-CM

## 2016-01-02 DIAGNOSIS — Z515 Encounter for palliative care: Secondary | ICD-10-CM

## 2016-01-02 MED ORDER — DEXTROSE 5 % IV SOLN
INTRAVENOUS | Status: DC
  Administered 2016-01-02 – 2016-01-03 (×2): via INTRAVENOUS

## 2016-01-02 NOTE — Progress Notes (Signed)
[facility] PROGRESS NOTE    Progress Note  Sandra Flowers  [id number] DOB: 12-12-1924 DOA: 12/27/2015 PCP: Merlene Laughter, MD     Brief Narrative:   Sandra Flowers is an 82 y.o. female history of chronic sacral decubitus ulcer who presented to the [facility] on 12/27/2015 with decreased responsiveness over the last several days, poor oral intake and was found to have ESBL UTI, hypotension hyponatremia acute renal failure due to sepsis  Assessment/Plan:   Sepsis likely due ESBL UTI: Her sepsis physiology has resolved. Urine culture grew ESBL Klebsiella and Proteus and she is currently on Primaxim. She still confused as she has had poor oral intake shows seborrheic poor prognosis. PMT met with family, they agreed to finish abx and then transfer to Hospice facility  Acute metabolic encephalopathy: Multifactorial likely due to sepsis and hypernatremia, overlying baseline dementia. As per family members she was managing her finances and driving until January 2017, which seems unlikely. The patient continues to be severely confused and unable to carry on a conversation.  Acute kidney injury: Likely due to sepsis on admission her creatinine was 2.3. Heart baseline is around 0.8, creatinine has plateau. Change fluid to D5w.  Hyperkalemia: Resolved.  Chronic  Decubitus ulcer, infected: He admitted on March 2017 for an infected sacral decubitus ulcer debridement performed by general surgery D/c on  Cipro. Continue overlay mattress, wound care.  Chronic protein caloric malnutrition severe: Patient is currently nothing by mouth due to poor mentation.  Normocytic anemia likely due to chronic disease: Clinical Hemoccult is positive, but she is not a candidate for any intervention at this point. hbg has remained stable.   Hyponatremia likely due to sepsis:  Resolved with IV fluid hydration.  Physical deconditioning   DVT prophylaxis: DNR Family  Communication:none Disposition Plan/Barrier to D/C: Hospice in am Code Status:     Code Status Orders        Start     Ordered   12/27/15 2033  Do not attempt resuscitation (DNR)   Continuous    Question Answer Comment  In the event of cardiac or respiratory ARREST Do not call a "code blue"   In the event of cardiac or respiratory ARREST Do not perform Intubation, CPR, defibrillation or ACLS   In the event of cardiac or respiratory ARREST Use medication by any route, position, wound care, and other measures to relive pain and suffering. May use oxygen, suction and manual treatment of airway obstruction as needed for comfort.      12/27/15 2032    Code Status History    Date Active Date Inactive Code Status Order ID Comments User Context   12/27/2015  5:35 PM 12/27/2015  8:32 PM DNR [id number]  Fredia Sorrow, MD ED   10/03/2015  1:54 PM 10/03/2015  7:01 PM DNR [id number]  Modena Jansky, MD Inpatient   09/26/2015  6:29 PM 10/03/2015  1:54 PM Full Code [id number]  Leana Gamer, MD Inpatient    Advance Directive Documentation        Most Recent Value   Type of Advance Directive  Healthcare Power of [place], Living will   Pre-existing out of facility DNR order (yellow form or pink MOST form)     "MOST" Form in Place?          IV Access:    Peripheral IV   Procedures and diagnostic studies:   No results found.   Medical Consultants:    None.  Anti-Infectives:  Vancomycin and Primaxin.  Subjective:    Sandra Flowers patient is unresponsive, son at bedside.  Objective:    Filed Vitals:   01/01/16 0538 01/01/16 1459 01/01/16 2136 01/02/16 0506  BP: 92/61 101/60 100/65 122/65  Pulse: 110 101 100 100  Temp: 98.1 F (36.7 C) 97.9 F (36.6 C) 97.5 F (36.4 C) 97.4 F (36.3 C)  TempSrc: Axillary Axillary Axillary Axillary  Resp: 20 20 20 20   Height:      Weight: 72.349 kg (159 lb 8 oz)   71.759 kg (158 lb 3.2 oz)  SpO2: 96% 94% 94% 98%     Intake/Output Summary (Last 24 hours) at 01/02/16 1110 Last data filed at 01/02/16 0733  Gross per 24 hour  Intake 691.67 ml  Output   1325 ml  Net -633.33 ml   Filed Weights   12/31/15 0615 01/01/16 0538 01/02/16 0506  Weight: 68 kg (149 lb 14.6 oz) 72.349 kg (159 lb 8 oz) 71.759 kg (158 lb 3.2 oz)    Exam: General exam: In no acute distress.Cachectic appearing severely malnourished Respiratory system: Good air movement and clear to auscultation. Cardiovascular system: S1 & S2 heard, RRR. No JVD. Gastrointestinal system: Abdomen is nondistended, soft and nontender.  Central nervous system: Alert and oriented. No focal neurological deficits. Extremities: No pedal edema. Skin: No rashes, lesions or ulcers Psychiatry: unresponsive.   Data Reviewed:    Labs: Basic Metabolic Panel:  Recent Labs Lab 12/28/15 0838 12/28/15 1240 12/29/15 1949 12/30/15 2310 12/31/15 0512 01/01/16 0516  NA 150* 150* 146* 143  --  143  K 3.4* 3.6 3.5 3.2*  --  3.6  CL 124* 124* 122* 123*  --  125*  CO2 17* 13* 15* 13*  --  14*  GLUCOSE 75 66 48* 65  --  88  BUN 87* 85* 75* 55*  --  47*  CREATININE 2.21* 2.14* 1.78* 1.41*  --  1.40*  CALCIUM 7.7* 7.9* 7.6* 7.8*  --  7.9*  MG  --   --   --   --  2.1  --    GFR Estimated Creatinine Clearance: 26.5 mL/min (by C-G formula based on Cr of 1.4). Liver Function Tests:  Recent Labs Lab 12/27/15 1432 12/28/15 0838 12/29/15 1949  AST 53* 41 32  ALT 17 16 15   ALKPHOS 128* 91 85  BILITOT 0.8 0.7 0.5  PROT 6.9 5.2* 5.0*  ALBUMIN 2.2* 1.7* 1.7*   No results for input(s): LIPASE, AMYLASE in the last 168 hours. No results for input(s): AMMONIA in the last 168 hours. Coagulation profile No results for input(s): INR, PROTIME in the last 168 hours.  CBC:  Recent Labs Lab 12/28/15 0838 12/29/15 1949 12/30/15 0855 12/31/15 0512 01/01/16 0516  WBC 18.6* 15.3* 15.0* 12.1* 10.4  NEUTROABS  --   --  13.2*  --   --   HGB 7.9* 6.8* 10.2*  10.6* 10.0*  HCT 26.3* 22.8* 30.9* 32.7* 30.4*  MCV 100.4* 99.6 88.0 89.8 89.4  PLT 224 224 PLATELET CLUMPS NOTED ON SMEAR, COUNT APPEARS ADEQUATE 155 132*   Cardiac Enzymes:  Recent Labs Lab 12/27/15 2250 12/28/15 0838 12/28/15 1240 12/29/15 1949  TROPONINI 0.06* 0.05* 0.10* 0.03   BNP (last 3 results) No results for input(s): PROBNP in the last 8760 hours. CBG:  Recent Labs Lab 12/27/15 1413  GLUCAP 75   D-Dimer: No results for input(s): DDIMER in the last 72 hours. Hgb A1c: No results for input(s): HGBA1C in the last 72  hours. Lipid Profile: No results for input(s): CHOL, HDL, LDLCALC, TRIG, CHOLHDL, LDLDIRECT in the last 72 hours. Thyroid function studies: No results for input(s): TSH, T4TOTAL, T3FREE, THYROIDAB in the last 72 hours.  Invalid input(s): FREET3 Anemia work up: No results for input(s): VITAMINB12, FOLATE, FERRITIN, TIBC, IRON, RETICCTPCT in the last 72 hours. Sepsis Labs:  Recent Labs Lab 12/27/15 1845 12/28/15 0017 12/29/15 1949 12/30/15 0855 12/30/15 2310 12/31/15 0512 01/01/16 0516  WBC  --  18.6* 15.3* 15.0*  --  12.1* 10.4  LATICACIDVEN 1.81 1.2  --   --  1.2  --   --    Microbiology Recent Results (from the past 240 hour(s))  Urine culture     Status: Abnormal   Collection Time: 12/27/15  5:52 PM  Result Value Ref Range Status   Specimen Description URINE, CATHETERIZED  Final   Special Requests NONE  Final   Culture (A)  Final    >=100,000 COLONIES/mL PROTEUS MIRABILIS >=100,000 COLONIES/mL KLEBSIELLA PNEUMONIAE Confirmed Extended Spectrum Beta-Lactamase Producer (ESBL) Performed at [facility]    Report Status 12/30/2015 FINAL  Final   Organism ID, Bacteria PROTEUS MIRABILIS (A)  Final   Organism ID, Bacteria KLEBSIELLA PNEUMONIAE (A)  Final      Susceptibility   Klebsiella pneumoniae - MIC*    AMPICILLIN >=32 RESISTANT Resistant     CEFAZOLIN >=64 RESISTANT Resistant     CEFTRIAXONE >=64 RESISTANT Resistant      CIPROFLOXACIN 1 SENSITIVE Sensitive     GENTAMICIN <=1 SENSITIVE Sensitive     IMIPENEM <=0.25 SENSITIVE Sensitive     NITROFURANTOIN 64 INTERMEDIATE Intermediate     TRIMETH/SULFA >=320 RESISTANT Resistant     AMPICILLIN/SULBACTAM >=32 RESISTANT Resistant     PIP/TAZO 32 INTERMEDIATE Intermediate     * >=100,000 COLONIES/mL KLEBSIELLA PNEUMONIAE   Proteus mirabilis - MIC*    AMPICILLIN <=2 SENSITIVE Sensitive     CEFAZOLIN <=4 SENSITIVE Sensitive     CEFTRIAXONE <=1 SENSITIVE Sensitive     CIPROFLOXACIN >=4 RESISTANT Resistant     GENTAMICIN <=1 SENSITIVE Sensitive     IMIPENEM 2 SENSITIVE Sensitive     NITROFURANTOIN 128 RESISTANT Resistant     TRIMETH/SULFA <=20 SENSITIVE Sensitive     AMPICILLIN/SULBACTAM <=2 SENSITIVE Sensitive     PIP/TAZO <=4 SENSITIVE Sensitive     * >=100,000 COLONIES/mL PROTEUS MIRABILIS  Blood Culture (routine x 2)     Status: None   Collection Time: 12/27/15  6:38 PM  Result Value Ref Range Status   Specimen Description BLOOD LEFT ANTECUBITAL  Final   Special Requests BOTTLES DRAWN AEROBIC AND ANAEROBIC 5CC  Final   Culture   Final    NO GROWTH 5 DAYS Performed at [facility]    Report Status 01/01/2016 FINAL  Final  Blood Culture (routine x 2)     Status: None   Collection Time: 12/27/15  6:38 PM  Result Value Ref Range Status   Specimen Description BLOOD LEFT ANTECUBITAL  Final   Special Requests BOTTLES DRAWN AEROBIC AND ANAEROBIC 5CC  Final   Culture   Final    NO GROWTH 5 DAYS Performed at [facility]    Report Status 01/01/2016 FINAL  Final  MRSA PCR Screening     Status: Abnormal   Collection Time: 12/27/15  8:30 PM  Result Value Ref Range Status   MRSA by PCR POSITIVE (A) NEGATIVE Final    Comment:        The GeneXpert  MRSA Assay (FDA approved for NASAL specimens only), is one component of a comprehensive MRSA colonization surveillance program. It is not intended to diagnose MRSA infection nor to guide or monitor  treatment for MRSA infections. RESULT CALLED TO, READ BACK BY AND VERIFIED WITH: L VERGELDEDIOS RN @ [id number] ON 12/27/15 BY C DAVIS      Medications:   . imipenem-cilastatin  500 mg Intravenous Q12H  . pantoprazole (PROTONIX) IV  40 mg Intravenous Q24H  . sodium chloride  250 mL Intravenous Once  . sodium chloride flush  3 mL Intravenous Q12H  . vancomycin  750 mg Intravenous Q24H   Continuous Infusions: . dextrose 5 % and 0.45 % NaCl with KCl 20 mEq/L 25 mL/hr at 01/02/16 0903    Time spent: 15 min   LOS: 6 days   Charlynne Cousins  [facility] Pager [phone]  *Please refer to [email or web address], password [email or web address] to get updated schedule on who will round on this patient, as hospitalists switch teams weekly. If 7PM-7AM, please contact night-coverage at [email or web address], password [email or web address] for any overnight needs.  01/02/2016, 11:10 AM

## 2016-01-02 NOTE — Progress Notes (Addendum)
CSW received consult for residential hospice facility placement. CSW spoke with patient's daughter, Sandra Flowers (cell#: [phone]) to confirm that their first choice would be [facility]. CSW made referral to Becky Augusta, [place] RN - awaiting response back re: bed availability/eligibility.    Raynaldo Opitz, [facility] Clinical Social Worker cell #: [phone]

## 2016-01-02 NOTE — Progress Notes (Signed)
CSW received confirmation from Collie Siad at [facility] that scheduled registration meeting with daughter at 3:30pm today to discharge to [facility] tomorrow.   CSW will follow-up tomorrow to facilitate discharge.   Raynaldo Opitz, [facility] Clinical Social Worker cell #: [phone]

## 2016-01-02 NOTE — Progress Notes (Signed)
Chaplain responding to spiritual care consult.  Introduced spiritual care as resource and provided brief emotional and spiritual support with pt's son and daughter.    Faith is important in family's understanding of end of life - they express peace and are focused on creating a good transition for pt.  Pt's son, Jeneen Rinks, reports that his brother is "having a hard time."   Chaplain was not able to explore this during visit, as Hospice representative was present to meet with family.     Jeneen Rinks reports they are attempting to contact pt's church for communion.  If church is not able to provide communion in hospital, he will call on spiritual care.    Will forward for follow up care as needed.     [place], [place]

## 2016-01-02 NOTE — Progress Notes (Signed)
Nutrition Brief Note  Chart reviewed. Pt now transitioning to comfort care.  No further nutrition interventions warranted at this time.  Please consult as needed.   Inetta Dicke, MS, RD, LDN Pager: [phone] After Hours Pager: [phone]    

## 2016-01-02 NOTE — Progress Notes (Signed)
SLP Cancellation Note  Patient Details Name: Sandra Flowers MRN: [id number] DOB: November 03, 1924   Cancelled treatment:       Reason Eval/Treat Not Completed:  (pt for dc to hospice per review of chart, will sign off, please reorder if desire)   Luanna Salk, [facility] SLP [phone]

## 2016-01-02 NOTE — Progress Notes (Signed)
[facility] - [id number]   [facility] RN Liason Note   Received request from [place] for family interest in [facility].   Chart has been reviewed and patient is approved for transfer to [facility] for tomorrow. CSW aware.  TC was placed to the daughter Nicola Girt to explain services and confirm interest.     Daughter would like  to have further discussion with her brother.  Awaiting  return call to schedule registration visit.    Thank you for this referral.    Will update CSW once registration visit is scheduled or if bed becomes available sooner than tomorrow.   Please call with any questions.   Mickie Kay, [facility] Liason  [phone]

## 2016-01-02 NOTE — Progress Notes (Signed)
[facility] - [id number]  [facility] RN Liason Note   Met with patient's daughter Nicola Girt and son to confirm interest in [facility] and to explain services.   Family is agreeable for transfer on Friday 01/03/16.  CSW is aware. All registration paperwork has been completed.  Dr. Orpah Melter is to assume care per family request.     Please fax discharge summary to [phone].   RN please call report to [phone].   Please arrange transport for patient to arrive before noon if possible.  Thank you!   Mickie Kay, [facility] Liason  [phone]

## 2016-01-03 DIAGNOSIS — Z7189 Other specified counseling: Secondary | ICD-10-CM

## 2016-01-03 LAB — CALCIUM / CREATININE RATIO, URINE
CREATININE, UR: 94.3 mg/dL
Calcium, Ur: <0.8 mg/dL

## 2016-01-03 MED ORDER — MORPHINE SULFATE (CONCENTRATE) 10 MG/0.5ML PO SOLN: 20.0000 mg | ORAL | Status: AC | PRN

## 2016-01-03 NOTE — Progress Notes (Signed)
Patient is set to discharge to [facility] today. Patient & family at bedside made aware. Discharge packet given to RN, Joellen Jersey. PTAR called for transport.     Raynaldo Opitz, [facility] Clinical Social Worker cell #: [phone]

## 2016-01-03 NOTE — Progress Notes (Addendum)
Report called to Presenter, broadcasting at [facility]. All questions answered.  VSS.  Pt wheeled out by PTAR.

## 2016-01-03 NOTE — Discharge Summary (Signed)
Physician Discharge Summary  Sandra Flowers HXT:[id number] DOB: 11-06-1924 DOA: 12/27/2015  PCP: Merlene Laughter, MD  Admit date: 12/27/2015 Discharge date: 01/03/2016  Time spent: 35 minutes  Recommendations for Outpatient Follow-up:  1. Will be transferred to [facility].   Discharge Diagnoses:  Principal Problem:   Acute encephalopathy Active Problems:   Decubitus ulcer, infected   Dehydration   AKI (acute kidney injury) (HCC)   Elevated troponin   Hypokalemia   Hypernatremia   Protein-calorie malnutrition (HCC)   Physical deconditioning   Complicated UTI (urinary tract infection)   Protein-calorie malnutrition, severe   Normocytic anemia   Palliative care encounter   Goals of care, counseling/discussion   Encounter for hospice care discussion   Hospice care   Discharge Condition: Guarded  Diet recommendation: comfort feeds  Filed Weights   12/31/15 0615 01/01/16 0538 01/02/16 0506  Weight: 68 kg (149 lb 14.6 oz) 72.349 kg (159 lb 8 oz) 71.759 kg (158 lb 3.2 oz)    History of present illness:  82 year old with past medical history of meningioma, dementia, chronic sacral decubitus ulcer who comes into the hospital for decreased responsiveness for the last several days.  Hospital Course:  Sepsis likely due to ESBL UTI: Patient was resuscitated aggressively with IV antibiotics and IV fluid her sepsis etiology resolved urine culture grew ESBL Klebsiella and Proteus she was treated with 8 days of Primaxin. Patient has a poor prognoses and family met with PMT. Family had a conversation with palliative care and they decided to move towards comfort care she finished her antibiotic treatment and was started to be can place for comfort measures.  Acute metabolic encephalopathy: Likely multifactorial due to sepsis and hypernatremia superimposing overlying dementia.  Acute kidney injury: Likely due to sepsis, she was started with a fluid her creatinine improved to 1.4.  The family decided to pursue no further labs.  Hyperkalemia: Treated and resolved.  Chronic decubitus stage IV ulcer probably infected: She was admitted on March 2017 for an infected sacral decubitus ulcer which required debridement.  Normocytic anemia likely due to chronic: Chronic protein caloric malnutrition.  Procedures:  CXR  Renal US  Consultations:  PMT  Discharge Exam: Filed Vitals:   01/02/16 2152 01/03/16 0445  BP: 139/92 115/78  Pulse: 67 107  Temp: 97.3 F (36.3 C) 97 F (36.1 C)  Resp: 18 18    General: A&O x1 Cardiovascular: RRR Respiratory: good air movement CTA B/L  Discharge Instructions   Discharge Instructions    Diet - low sodium heart healthy    Complete by:  As directed      Increase activity slowly    Complete by:  As directed           Current Discharge Medication List    START taking these medications   Details  Morphine Sulfate (MORPHINE CONCENTRATE) 10 MG/0.5ML SOLN concentrated solution Take 1 mL (20 mg total) by mouth every 3 (three) hours as needed for severe pain. Qty: 180 mL, Refills: 0      STOP taking these medications     Amino Acids-Protein Hydrolys (FEEDING SUPPLEMENT, PRO-STAT SUGAR FREE 64,) LIQD      amoxicillin-clavulanate (AUGMENTIN) 875-125 MG tablet      cetirizine (ZYRTEC) 10 MG tablet      collagenase (SANTYL) ointment      dipyridamole-aspirin (AGGRENOX) 200-25 MG 12hr capsule      ferrous sulfate 325 (65 FE) MG tablet      Menthol-Methyl Salicylate (BENGAY GREASELESS EX)  mirtazapine (REMERON) 7.5 MG tablet      Multiple Vitamin (MULTIVITAMIN WITH MINERALS) TABS tablet      oxyCODONE (OXYCONTIN) 10 mg 12 hr tablet      oxyCODONE-acetaminophen (PERCOCET/ROXICET) 5-325 MG tablet      Polyethyl Glycol-Propyl Glycol (SYSTANE) 0.4-0.3 % GEL ophthalmic gel      polyvinyl alcohol (LIQUIFILM TEARS) 1.4 % ophthalmic solution      saccharomyces boulardii (FLORASTOR) 250 MG capsule       senna-docusate (SENOKOT-S) 8.6-50 MG tablet      vitamin C (ASCORBIC ACID) 500 MG tablet        No Known Allergies    The results of significant diagnostics from this hospitalization (including imaging, microbiology, ancillary and laboratory) are listed below for reference.    Significant Diagnostic Studies: US Renal Port  12/27/2015  CLINICAL DATA:  Acute renal failure. EXAM: RENAL / URINARY TRACT ULTRASOUND COMPLETE COMPARISON:  None. FINDINGS: Right Kidney: Length: 9.3 cm. The right least 2 cysts in the right kidney, interpolar region measuring 3.1 x 2.5 x 2.6 cm. Posterior interpolar measures 5.9 x 3.5 x 4.5 cm. Technically limited evaluation due to difficulty with patient positioning. Echogenicity within normal limits. No hydronephrosis visualized. Left Kidney: Length: 10.3 cm. Cyst measures 4.8 x 4.0 x 4.7 cm and may have an internal septation. No hydronephrosis visualized. Bladder: Not evaluated, decompressed by Foley catheter. IMPRESSION: 1. No obstructive uropathy. 2. Bilateral renal cysts. On the left this may have an internal septation. On the right these are difficult to evaluate due to patient positioning. There are no prior exams for comparison, comparison with any prior imaging would be most helpful. This could be further evaluated with renal protocol CT or MRI versus follow-up ultrasound to evaluate for stability, with consideration given to patient age and personal preference. Electronically Signed   By: Jeb Levering M.D.   On: 12/27/2015 23:49   Dg Chest Port 1 View  12/27/2015  CLINICAL DATA:  Shortness of breath. EXAM: PORTABLE CHEST 1 VIEW COMPARISON:  None. FINDINGS: The heart size and mediastinal contours are within normal limits. Both lungs are clear. No pneumothorax or pleural effusion is noted. Narrowing of the subacromial space is noted bilaterally suggesting rotator cuff injury. IMPRESSION: No acute cardiopulmonary abnormality seen. Electronically Signed   By: Marijo Conception, M.D.   On: 12/27/2015 18:19    Microbiology: Recent Results (from the past 240 hour(s))  Urine culture     Status: Abnormal   Collection Time: 12/27/15  5:52 PM  Result Value Ref Range Status   Specimen Description URINE, CATHETERIZED  Final   Special Requests NONE  Final   Culture (A)  Final    >=100,000 COLONIES/mL PROTEUS MIRABILIS >=100,000 COLONIES/mL KLEBSIELLA PNEUMONIAE Confirmed Extended Spectrum Beta-Lactamase Producer (ESBL) Performed at [facility]    Report Status 12/30/2015 FINAL  Final   Organism ID, Bacteria PROTEUS MIRABILIS (A)  Final   Organism ID, Bacteria KLEBSIELLA PNEUMONIAE (A)  Final      Susceptibility   Klebsiella pneumoniae - MIC*    AMPICILLIN >=32 RESISTANT Resistant     CEFAZOLIN >=64 RESISTANT Resistant     CEFTRIAXONE >=64 RESISTANT Resistant     CIPROFLOXACIN 1 SENSITIVE Sensitive     GENTAMICIN <=1 SENSITIVE Sensitive     IMIPENEM <=0.25 SENSITIVE Sensitive     NITROFURANTOIN 64 INTERMEDIATE Intermediate     TRIMETH/SULFA >=320 RESISTANT Resistant     AMPICILLIN/SULBACTAM >=32 RESISTANT Resistant     PIP/TAZO 32  INTERMEDIATE Intermediate     * >=100,000 COLONIES/mL KLEBSIELLA PNEUMONIAE   Proteus mirabilis - MIC*    AMPICILLIN <=2 SENSITIVE Sensitive     CEFAZOLIN <=4 SENSITIVE Sensitive     CEFTRIAXONE <=1 SENSITIVE Sensitive     CIPROFLOXACIN >=4 RESISTANT Resistant     GENTAMICIN <=1 SENSITIVE Sensitive     IMIPENEM 2 SENSITIVE Sensitive     NITROFURANTOIN 128 RESISTANT Resistant     TRIMETH/SULFA <=20 SENSITIVE Sensitive     AMPICILLIN/SULBACTAM <=2 SENSITIVE Sensitive     PIP/TAZO <=4 SENSITIVE Sensitive     * >=100,000 COLONIES/mL PROTEUS MIRABILIS  Blood Culture (routine x 2)     Status: None   Collection Time: 12/27/15  6:38 PM  Result Value Ref Range Status   Specimen Description BLOOD LEFT ANTECUBITAL  Final   Special Requests BOTTLES DRAWN AEROBIC AND ANAEROBIC 5CC  Final   Culture   Final    NO GROWTH 5  DAYS Performed at [facility]    Report Status 01/01/2016 FINAL  Final  Blood Culture (routine x 2)     Status: None   Collection Time: 12/27/15  6:38 PM  Result Value Ref Range Status   Specimen Description BLOOD LEFT ANTECUBITAL  Final   Special Requests BOTTLES DRAWN AEROBIC AND ANAEROBIC 5CC  Final   Culture   Final    NO GROWTH 5 DAYS Performed at [facility]    Report Status 01/01/2016 FINAL  Final  MRSA PCR Screening     Status: Abnormal   Collection Time: 12/27/15  8:30 PM  Result Value Ref Range Status   MRSA by PCR POSITIVE (A) NEGATIVE Final    Comment:        The GeneXpert MRSA Assay (FDA approved for NASAL specimens only), is one component of a comprehensive MRSA colonization surveillance program. It is not intended to diagnose MRSA infection nor to guide or monitor treatment for MRSA infections. RESULT CALLED TO, READ BACK BY AND VERIFIED WITH: L VERGELDEDIOS RN @ [id number] ON 12/27/15 BY C DAVIS      Labs: Basic Metabolic Panel:  Recent Labs Lab 12/28/15 0838 12/28/15 1240 12/29/15 1949 12/30/15 2310 12/31/15 0512 01/01/16 0516  NA 150* 150* 146* 143  --  143  K 3.4* 3.6 3.5 3.2*  --  3.6  CL 124* 124* 122* 123*  --  125*  CO2 17* 13* 15* 13*  --  14*  GLUCOSE 75 66 48* 65  --  88  BUN 87* 85* 75* 55*  --  47*  CREATININE 2.21* 2.14* 1.78* 1.41*  --  1.40*  CALCIUM 7.7* 7.9* 7.6* 7.8*  --  7.9*  MG  --   --   --   --  2.1  --    Liver Function Tests:  Recent Labs Lab 12/27/15 1432 12/28/15 0838 12/29/15 1949  AST 53* 41 32  ALT 17 16 15   ALKPHOS 128* 91 85  BILITOT 0.8 0.7 0.5  PROT 6.9 5.2* 5.0*  ALBUMIN 2.2* 1.7* 1.7*   No results for input(s): LIPASE, AMYLASE in the last 168 hours. No results for input(s): AMMONIA in the last 168 hours. CBC:  Recent Labs Lab 12/28/15 0838 12/29/15 1949 12/30/15 0855 12/31/15 0512 01/01/16 0516  WBC 18.6* 15.3* 15.0* 12.1* 10.4  NEUTROABS  --   --  13.2*  --   --   HGB 7.9*  6.8* 10.2* 10.6* 10.0*  HCT 26.3* 22.8* 30.9* 32.7* 30.4*  MCV 100.4*  99.6 88.0 89.8 89.4  PLT 224 224 PLATELET CLUMPS NOTED ON SMEAR, COUNT APPEARS ADEQUATE 155 132*   Cardiac Enzymes:  Recent Labs Lab 12/27/15 2250 12/28/15 0838 12/28/15 1240 12/29/15 1949  TROPONINI 0.06* 0.05* 0.10* 0.03   BNP: BNP (last 3 results) No results for input(s): BNP in the last 8760 hours.  ProBNP (last 3 results) No results for input(s): PROBNP in the last 8760 hours.  CBG:  Recent Labs Lab 12/27/15 1413  GLUCAP 75     Signed:  Charlynne Cousins MD.  [facility] 01/03/2016, 11:23 AM

## 2016-01-11 DEATH — deceased

## 2018-04-18 IMAGING — CT CT HEAD W/O CM
2 of 6 series · 12 of 47 positions shown, 15 images · non-contrast
Comparison: None.

CLINICAL DATA: Confusion.  Unwitnessed fall

EXAM:
CT HEAD WITHOUT CONTRAST
CT CERVICAL SPINE WITHOUT CONTRAST
TECHNIQUE: Multidetector CT imaging of the head and cervical spine was
performed following the standard protocol without intravenous
contrast. Multiplanar CT image reconstructions of the cervical spine
were also generated.

[Series 8: coronal · coronal · 0.23mm/px · 3 of 60 slices shown]
[im 20/60  brain]
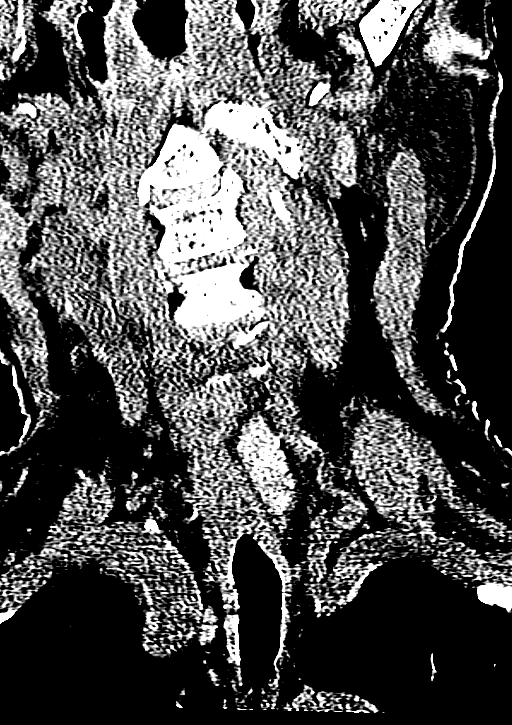
[im 27/60  brain]
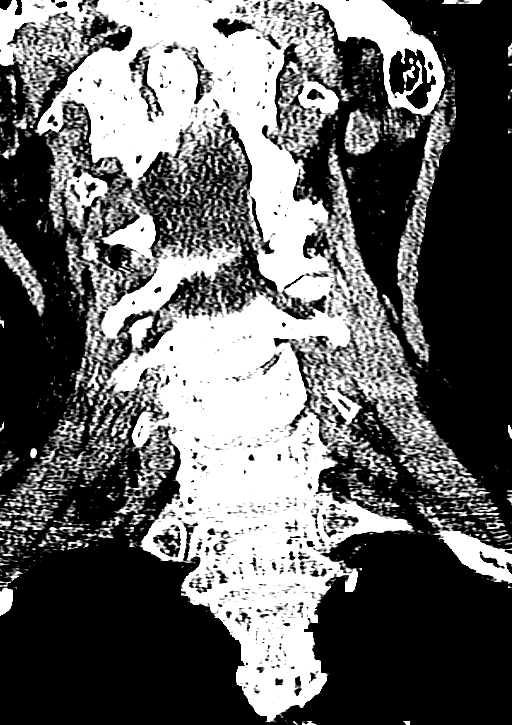
[im 33/60  brain]
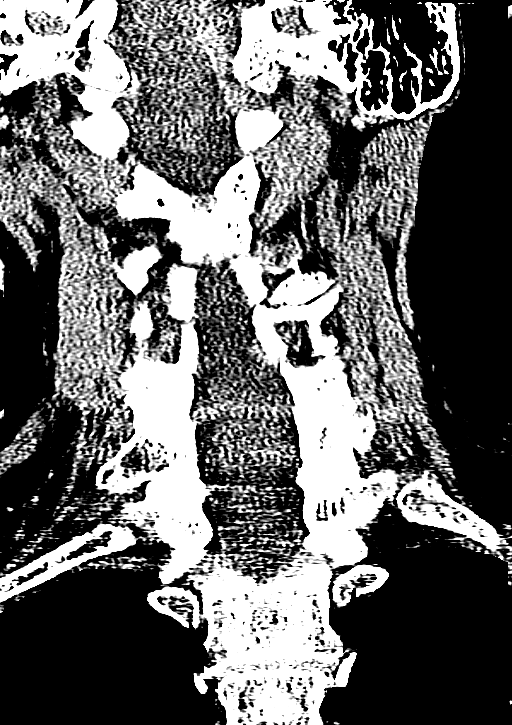

[Series 10: axial recon · axial · 0.23mm/px · z∈[+1179,+1302]mm · 9 of 92 slices shown, 12 images]
[im 10/92  brain]
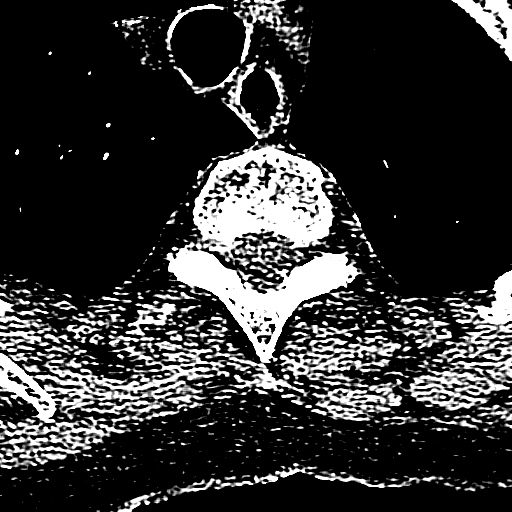
[im 10/92  bone]
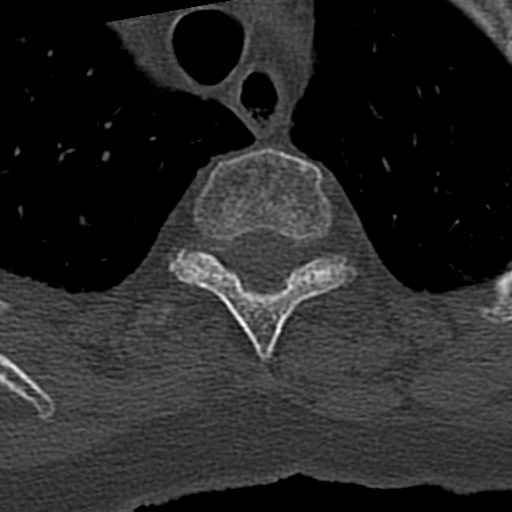
[im 19/92  brain]
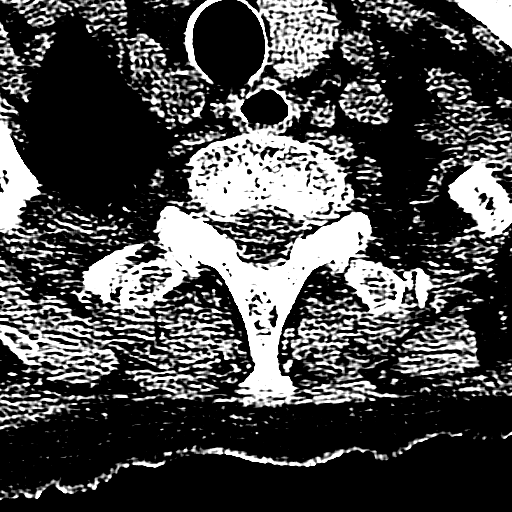
[im 28/92  brain]
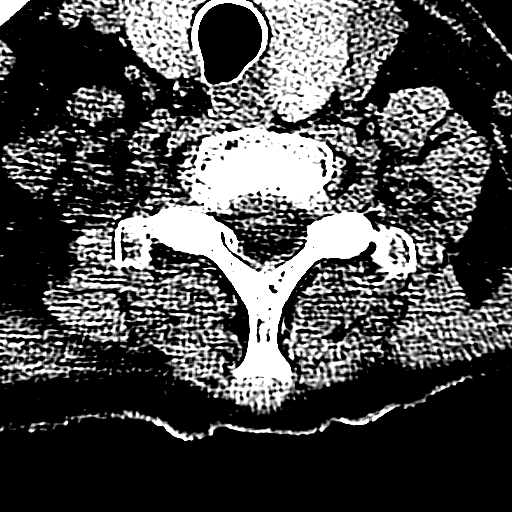
[im 37/92  brain]
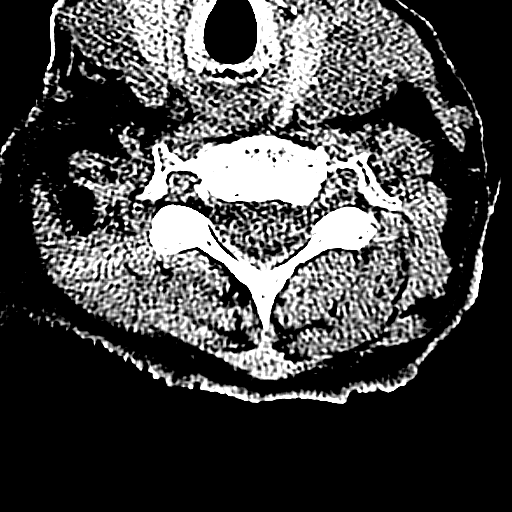
[im 46/92  brain]
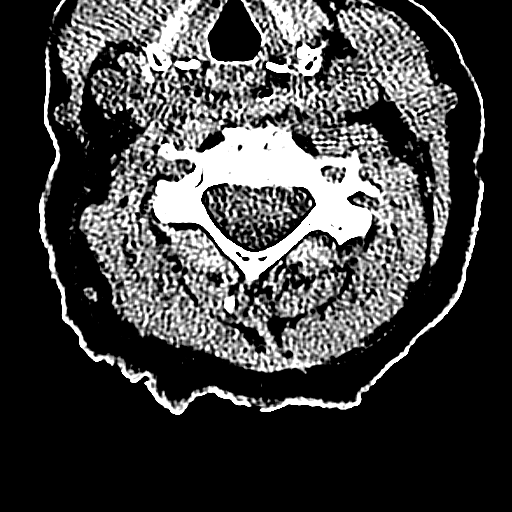
[im 46/92  bone]
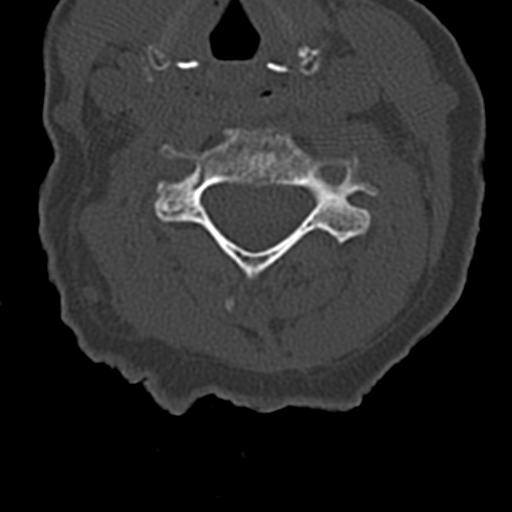
[im 55/92  brain]
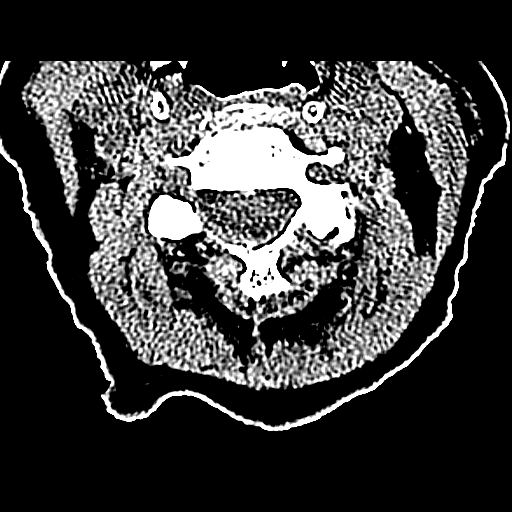
[im 64/92  brain]
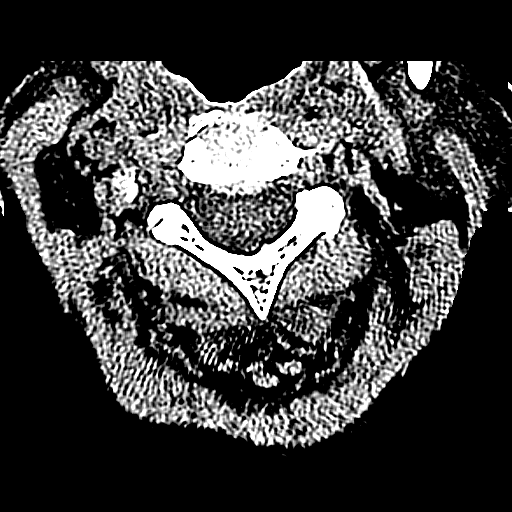
[im 73/92  brain]
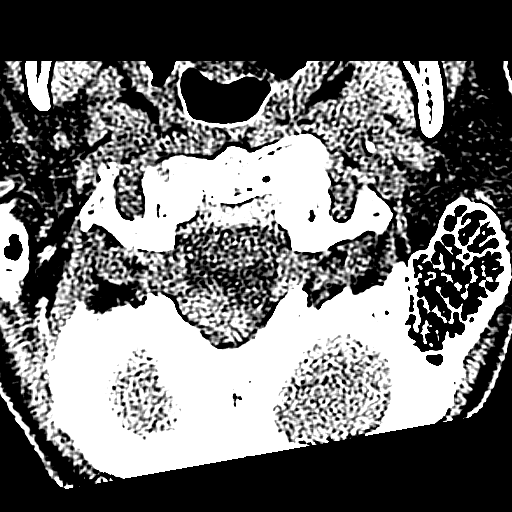
[im 82/92  brain]
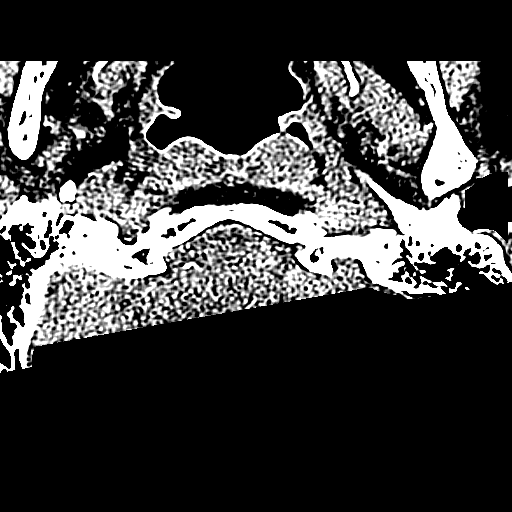
[im 82/92  bone]
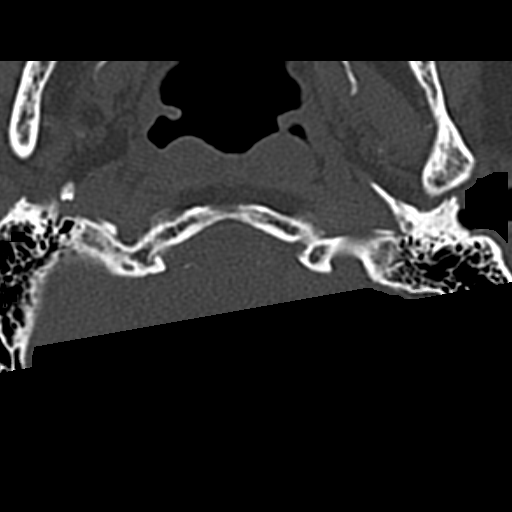

[12 of 47 positions shown; findings below may reference images not displayed]

FINDINGS: CT HEAD FINDINGS

There is moderate diffuse atrophy. There is vasogenic edema in the
right cerebellum just to the right of midline which is effacing the
fourth ventricle and displacing the fourth ventricle toward the
left. This area of edema in the posterior fossa measures 2.9 x
cm. There is an area of what appears to be irregular calcification
in the periphery of the right cerebellum measuring 3.2 x 1.9 cm. No
other evidence of mass. No hemorrhage is evident. No subdural or
epidural fluid collections are identified. There is no shift of the
lateral and third ventricles from the midline. Note that there is
effacement of the right quadrigeminal plate cistern posteriorly due
to the edema from the lesion in the right cerebellum. No herniation
is seen in this area, however.

There is patchy small vessel disease throughout the centra semiovale
bilaterally. There is a 7 mm probable arachnoid cyst in the medial
right temporal lobe. Right occipital lobe No acute infarct evident.
Bony calvarium appears intact. Mastoid air cells are clear. No
intraorbital lesions are evident.

Cerebellar tonsils are mildly low lying.

CT CERVICAL SPINE FINDINGS

There is no demonstrable fracture. There is 3 mm of anterolisthesis
of C3 on C4. There is 4 mm of anterolisthesis of C5 on C6. There is
2 mm of anterolisthesis of T1 on T2. These areas of anterolisthesis
are felt to be due to underlying spondylosis. Prevertebral soft
tissues and predental space regions are normal. There is mild disc
space narrowing at C3-4 and C5-6. There is facet hypertrophy at
multiple levels bilaterally. Facet hypertrophy is most marked at
C4-5 on the left.

The thyroid appears rather prominent. There is a focal area of
increased attenuation in the left lobe of the thyroid measuring 6 x
6 mm, likely a colloid cyst.
IMPRESSION: CT head: Vasogenic edema arising from the medial right cerebellum
effacing the fourth ventricle as well as partially effacing the
posterior right quadrigeminal plate cistern. The fourth ventricle is
deviated slightly toward the left. The third and lateral ventricles
are in the midline. Cerebellar tonsils are mildly low-lying.

There is irregular calcification along the periphery of the mid
upper right cerebellum. Suspect calcified meningioma in this area as
most likely etiology.

Elsewhere there is atrophy with periventricular small vessel
disease. No acute infarct evident. There is a small arachnoid cyst
in the medial right temporal lobe.

Suspect neoplasm as most likely etiology in the right cerebellum
causing the mass effect on the fourth ventricle and the right
quadrigeminal plate cistern. Advise brain MR pre and post-contrast
to further evaluate. MR also could be helpful to confirm that the
area of concern in the right cerebellum represents a meningioma as
opposed to calcifying mass of other etiology.

CT cervical spine: No fracture. Areas of spondylolisthesis likely
due to underlying spondylosis. Multifocal osteoarthritic change.

Critical Value/emergent results were called by telephone at the time
of interpretation on 11/27/2015 at [DATE] to Dr. JOOSE LUIS ARENALES , who
verbally acknowledged these results.
